# Patient Record
Sex: Male | Born: 1995 | Race: White | Hispanic: No | Marital: Single | Smoking: Never smoker
Health system: Southern US, Community
[De-identification: ages and names within clinical notes are randomized; demographics above are authoritative.]

## PROBLEM LIST (undated history)

## (undated) DIAGNOSIS — F909 Attention-deficit hyperactivity disorder, unspecified type: Secondary | ICD-10-CM

## (undated) DIAGNOSIS — K219 Gastro-esophageal reflux disease without esophagitis: Secondary | ICD-10-CM

## (undated) DIAGNOSIS — T7840XA Allergy, unspecified, initial encounter: Secondary | ICD-10-CM

## (undated) HISTORY — DX: Gastro-esophageal reflux disease without esophagitis: K21.9

## (undated) HISTORY — DX: Allergy, unspecified, initial encounter: T78.40XA

## (undated) HISTORY — DX: Attention-deficit hyperactivity disorder, unspecified type: F90.9

## (undated) HISTORY — PX: TONSILLECTOMY: SUR1361

---
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
## 2015-05-01 ENCOUNTER — Other Ambulatory Visit: Payer: Self-pay

## 2015-05-01 MED ORDER — OMEPRAZOLE 20 MG PO CPDR: 20.0000 mg | DELAYED_RELEASE_CAPSULE | Freq: Every day | ORAL | Status: DC

## 2015-06-30 ENCOUNTER — Ambulatory Visit (INDEPENDENT_AMBULATORY_CARE_PROVIDER_SITE_OTHER): Payer: BLUE CROSS/BLUE SHIELD | Admitting: Internal Medicine

## 2015-06-30 ENCOUNTER — Encounter: Payer: Self-pay | Admitting: Internal Medicine

## 2015-06-30 VITALS — BP 118/70 | HR 84 | Ht 71.5 in | Wt 203.5 lb

## 2015-06-30 DIAGNOSIS — K219 Gastro-esophageal reflux disease without esophagitis: Secondary | ICD-10-CM

## 2015-06-30 NOTE — Patient Instructions (Signed)
   See if you can stop the omeprazole by taking every other day for a few weeks and stop. Can use Pepcid complete as needed but if having frequent heartburn again then would go back on the omeprazole.  I can refill it but would need to see you in 1-2 years for more.  Contact me if you have bleeding, swallowing problems or unintentional weight loss.  I appreciate the opportunity to care for you. Iva Boop, MD, [facility]

## 2015-07-01 ENCOUNTER — Encounter: Payer: Self-pay | Admitting: Internal Medicine

## 2015-07-01 NOTE — Progress Notes (Signed)
   Subjective:    Patient ID: Alex Marquez, male    DOB: 1996-07-31, 19 y.o.   MRN: [id number] Chief complaint: Reflux disease follow-up HPI The patient is here with his mother today. He is doing very well on 20 mg omeprazole daily with no heartburn or indigestion symptoms. His mom relates she has had to take a PPI since her 30s. School is going well though he is no longer working part-time in the [employer] like he was able to do before. One caffeinated beverage daily Medications, allergies, past medical history, past surgical history, family history and social history are reviewed and updated in the EMR.  Review of Systems As above    Objective:   Physical Exam BP 118/70 mmHg  Pulse 84  Ht 5' 11.5" (1.816 m)  Wt 203 lb 8 oz (92.307 kg)  BMI 27.99 kg/m2 Wt Readings from Last 3 Encounters:  06/30/15 203 lb 8 oz (92.307 kg) (94 %*, Z = 1.55)  12/22/14 196 lb 4 oz (89.018 kg) (92 %*, Z = 1.43)   * Growth percentiles are based on CDC 2-20 Years data.      Assessment & Plan:   1. Gastroesophageal reflux disease, esophagitis presence not specified    he will see if he can come off PPI and take intermittent Pepcid complete or something equivalent to that. I think this is worth a try. It seems likely that he will need a daily PPI. I will see him every one to 2 years and can refill PPI in between if he needs that. All questions answered today.  I appreciate the opportunity to care for this patient. CC: France Ravens, MD

## 2015-09-28 ENCOUNTER — Other Ambulatory Visit: Payer: Self-pay

## 2015-09-28 MED ORDER — OMEPRAZOLE 20 MG PO CPDR: 20.0000 mg | DELAYED_RELEASE_CAPSULE | Freq: Every day | ORAL | Status: DC

## 2016-10-17 ENCOUNTER — Other Ambulatory Visit: Payer: Self-pay

## 2016-10-17 MED ORDER — OMEPRAZOLE 20 MG PO CPDR: 20.0000 mg | DELAYED_RELEASE_CAPSULE | Freq: Every day | ORAL | 5 refills | Status: DC

## 2016-10-17 NOTE — Telephone Encounter (Signed)
Omeprazole sent in as requested. 

## 2016-10-19 DIAGNOSIS — J0100 Acute maxillary sinusitis, unspecified: Secondary | ICD-10-CM | POA: Diagnosis not present

## 2017-08-11 ENCOUNTER — Telehealth: Payer: Self-pay | Admitting: Internal Medicine

## 2017-08-11 MED ORDER — OMEPRAZOLE 20 MG PO CPDR
20.0000 mg | DELAYED_RELEASE_CAPSULE | Freq: Every day | ORAL | 2 refills | Status: DC
Start: 2017-08-11 — End: 2017-10-17

## 2017-08-11 NOTE — Telephone Encounter (Signed)
Patient has January appointment , refilled omeprazole.

## 2017-10-17 ENCOUNTER — Ambulatory Visit: Payer: BLUE CROSS/BLUE SHIELD | Admitting: Internal Medicine

## 2017-10-17 ENCOUNTER — Encounter: Payer: Self-pay | Admitting: Internal Medicine

## 2017-10-17 VITALS — BP 110/80 | HR 68 | Ht 71.5 in | Wt 203.0 lb

## 2017-10-17 DIAGNOSIS — K219 Gastro-esophageal reflux disease without esophagitis: Secondary | ICD-10-CM | POA: Diagnosis not present

## 2017-10-17 MED ORDER — OMEPRAZOLE 20 MG PO CPDR: 20.0000 mg | DELAYED_RELEASE_CAPSULE | Freq: Every day | ORAL | 3 refills | Status: DC

## 2017-10-17 NOTE — Progress Notes (Signed)
   Alex Marquez 22 y.o. 12/30/1995 [id number]  Assessment & Plan:   Encounter Diagnosis  Name Primary?  . Gastroesophageal reflux disease, esophagitis presence not specified Yes    Doing well.  Refill PPI  See me in 2 yrs or prn Unless PCP can refill PPI -    Subjective:   Chief Complaint: f/u GERD  HPI Here for f/u - GERD well controlled on 20 mg omeprazole daily. Establishing with a PCP later this year  No Known Allergies Current Meds  Medication Sig  . omeprazole (PRILOSEC) 20 MG capsule Take 1 capsule (20 mg total) by mouth daily before breakfast.  . [DISCONTINUED] omeprazole (PRILOSEC) 20 MG capsule Take 1 capsule (20 mg total) by mouth daily before breakfast.   Past Medical History:  Diagnosis Date  . ADHD (attention deficit hyperactivity disorder)    pt states ADD  . GERD (gastroesophageal reflux disease)    Past Surgical History:  Procedure Laterality Date  . TONSILLECTOMY     Social History   Social History Narrative   Single, lives with his parents. 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   One to 2 glasses of tea daily as far as caffeine is concerned.   family history includes Colon cancer in his maternal grandfather; Colon polyps in his mother; Heart disease in his maternal grandmother; Irritable bowel syndrome in his unknown relative.   Review of Systems As above  Objective:   Physical Exam BP 110/80   Pulse 68   Ht 5' 11.5" (1.816 m)   Wt 203 lb (92.1 kg)   BMI 27.92 kg/m  NAD

## 2017-10-17 NOTE — Patient Instructions (Signed)
Prilosec reordered.  Follow up with us as needed.

## 2018-01-07 NOTE — Progress Notes (Addendum)
[facility] [address], [address] [place], [place] [address] [phone] [phone]  Date:  01/08/2018   Name:  Alex MinksLogan Marquez   DOB:  03/29/1996   MRN:  [id number]  PCP:  Pearline Cablesopland, Brooklee Michelin C, MD    Chief Complaint: New Patient (Initial Visit) (no current issues to discuss)   History of Present Illness:  Alex Marquez is a 22 y.o. very pleasant male patient who presents with the following:  Here today as a new patient to establish care History of reflux, on PPI per Dr. 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  [place]  S/p tonsillectomy- otherwise no other significant PMhx In his free time he enjoys doing some work outdoors, working on Dietitianvideo games, working on cars   He is single, no children  He is a non smoker, no tobacco, rare alcohol   No recent labs  He thinks he had a tetanus shot at least 10 years ago - would like to have booster today  He does not get a lot of formal exercise but is on his feet a lot at work  Patient Active Problem List   Diagnosis Date Noted  . Esophageal reflux 12/22/2014    Past Medical History:  Diagnosis Date  . ADHD (attention deficit hyperactivity disorder)    pt states ADD  . Allergy   . GERD (gastroesophageal reflux disease)     Past Surgical History:  Procedure Laterality Date  . TONSILLECTOMY    . TOOTH EXTRACTION  2008    Social History   Tobacco Use  . Smoking status: Never Smoker  . Smokeless tobacco: Never Used  Substance Use Topics  . Alcohol use: Yes    Alcohol/week: 0.0 oz    Comment: "not often"  . Drug use: No    Family History  Problem Relation Age of Onset  . Colon cancer Maternal Grandfather        great  . Colon polyps Mother   . Heart disease Maternal Grandmother   . Irritable bowel syndrome Unknown     No Known  Allergies  Medication list has been reviewed and updated.  Current Outpatient Medications on File Prior to Visit  Medication Sig Dispense Refill  . omeprazole (PRILOSEC) 20 MG capsule Take 1 capsule (20 mg total) by mouth daily before breakfast. 90 capsule 3   No current facility-administered medications on file prior to visit.     Review of Systems:  As per HPI- otherwise negative.   Physical Examination: Vitals:   01/08/18 1603  BP: 122/80  Pulse: 80  Resp: 16  SpO2: 98%   Vitals:   01/08/18 1603  Weight: 201 lb (91.2 kg)  Height: 5' 11.5" (1.816 m)   Body mass index is 27.64 kg/m. Ideal Body Weight: Weight in (lb) to have BMI = 25: 181.4  GEN: WDWN, NAD, Non-toxic, A & O x 3, looks well  HEENT: Atraumatic, Normocephalic. Neck supple. No masses, No LAD.  Bilateral TM wnl, oropharynx normal.  PEERL,EOMI.   Ears and Nose: No external deformity. CV: RRR, No M/G/R. No JVD. No thrill. No extra heart sounds. PULM: CTA B, no wheezes, crackles, rhonchi. No retractions. No resp. distress. No accessory muscle use. ABD: S, NT, ND EXTR: No c/c/e NEURO Normal gait.  PSYCH: Normally interactive. Conversant.  Not depressed or anxious appearing.  Calm demeanor.    Assessment and Plan: Gastroesophageal reflux disease, esophagitis presence not specified  Immunization due - Plan: Td vaccine greater than or equal to 7yo preservative free IM  Screening for hyperlipidemia - Plan: Lipid panel  Screening for diabetes mellitus - Plan: Comprehensive metabolic panel, Hemoglobin A1c  Screening for deficiency anemia - Plan: CBC  Establishing care today Labs pending- he is NOT fasting today We are glad to refill prilosec when needed Td today  Signed Abbe Amsterdam, MD  Received his labs, letter to pt Results for orders placed or performed in visit on 01/08/18  CBC  Result Value Ref Range   WBC 5.6 4.0 - 10.5 K/uL   RBC 5.41 4.22 - 5.81 Mil/uL   Platelets 217.0 150.0 - 400.0  K/uL   Hemoglobin 16.2 13.0 - 17.0 g/dL   HCT 14.7 82.9 - 56.2 %   MCV 85.8 78.0 - 100.0 fl   MCHC 34.9 30.0 - 36.0 g/dL   RDW 13.0 86.5 - 78.4 %  Comprehensive metabolic panel  Result Value Ref Range   Sodium 139 135 - 145 mEq/L   Potassium 3.7 3.5 - 5.1 mEq/L   Chloride 103 96 - 112 mEq/L   CO2 28 19 - 32 mEq/L   Glucose, Bld 79 70 - 99 mg/dL   BUN 14 6 - 23 mg/dL   Creatinine, Ser 6.96 0.40 - 1.50 mg/dL   Total Bilirubin 1.1 0.2 - 1.2 mg/dL   Alkaline Phosphatase 87 39 - 117 U/L   AST 16 0 - 37 U/L   ALT 13 0 - 53 U/L   Total Protein 7.0 6.0 - 8.3 g/dL   Albumin 4.6 3.5 - 5.2 g/dL   Calcium 9.6 8.4 - 29.5 mg/dL   GFR 28.41 >32.44 mL/min  Lipid panel  Result Value Ref Range   Cholesterol 132 0 - 200 mg/dL   Triglycerides 01.0 0.0 - 149.0 mg/dL   HDL 27.25 (L) >36.64 mg/dL   VLDL 40.3 0.0 - 47.4 mg/dL   LDL Cholesterol 81 0 - 99 mg/dL   Total CHOL/HDL Ratio 3    NonHDL 92.57   Hemoglobin A1c  Result Value Ref Range   Hgb A1c MFr Bld 4.9 4.6 - 6.5 %

## 2018-01-08 ENCOUNTER — Ambulatory Visit: Payer: BLUE CROSS/BLUE SHIELD | Admitting: Family Medicine

## 2018-01-08 ENCOUNTER — Encounter: Payer: Self-pay | Admitting: Family Medicine

## 2018-01-08 VITALS — BP 122/80 | HR 80 | Resp 16 | Ht 71.5 in | Wt 201.0 lb

## 2018-01-08 DIAGNOSIS — Z131 Encounter for screening for diabetes mellitus: Secondary | ICD-10-CM | POA: Diagnosis not present

## 2018-01-08 DIAGNOSIS — K219 Gastro-esophageal reflux disease without esophagitis: Secondary | ICD-10-CM | POA: Diagnosis not present

## 2018-01-08 DIAGNOSIS — Z130 Encounter for screening for diseases of the blood and blood-forming organs and certain disorders involving the immune mechanism: Secondary | ICD-10-CM

## 2018-01-08 DIAGNOSIS — Z13220 Encounter for screening for lipoid disorders: Secondary | ICD-10-CM

## 2018-01-08 DIAGNOSIS — Z23 Encounter for immunization: Secondary | ICD-10-CM

## 2018-01-08 NOTE — Patient Instructions (Signed)
Good to see you today- take care and I will be in touch with your labs asap You got a tetanus booster today  Continue to stay active

## 2018-01-09 LAB — CBC
HEMATOCRIT: 46.4 % (ref 39.0–52.0)
Hemoglobin: 16.2 g/dL (ref 13.0–17.0)
MCHC: 34.9 g/dL (ref 30.0–36.0)
MCV: 85.8 fl (ref 78.0–100.0)
Platelets: 217 10*3/uL (ref 150.0–400.0)
RBC: 5.41 Mil/uL (ref 4.22–5.81)
RDW: 12.5 % (ref 11.5–15.5)
WBC: 5.6 10*3/uL (ref 4.0–10.5)

## 2018-01-09 LAB — COMPREHENSIVE METABOLIC PANEL
ALT: 13 U/L (ref 0–53)
AST: 16 U/L (ref 0–37)
Albumin: 4.6 g/dL (ref 3.5–5.2)
Alkaline Phosphatase: 87 U/L (ref 39–117)
BUN: 14 mg/dL (ref 6–23)
CHLORIDE: 103 meq/L (ref 96–112)
CO2: 28 mEq/L (ref 19–32)
Calcium: 9.6 mg/dL (ref 8.4–10.5)
Creatinine, Ser: 1.15 mg/dL (ref 0.40–1.50)
GFR: 84.8 mL/min (ref >60.00)
GLUCOSE: 79 mg/dL (ref 70–99)
POTASSIUM: 3.7 meq/L (ref 3.5–5.1)
SODIUM: 139 meq/L (ref 135–145)
TOTAL PROTEIN: 7 g/dL (ref 6.0–8.3)
Total Bilirubin: 1.1 mg/dL (ref 0.2–1.2)

## 2018-01-09 LAB — LIPID PANEL
CHOL/HDL RATIO: 3
Cholesterol: 132 mg/dL (ref 0–200)
HDL: 39 mg/dL — ABNORMAL LOW (ref >39.00)
LDL Cholesterol: 81 mg/dL (ref 0–99)
NONHDL: 92.57
Triglycerides: 59 mg/dL (ref 0.0–149.0)
VLDL: 11.8 mg/dL (ref 0.0–40.0)

## 2018-01-09 LAB — HEMOGLOBIN A1C: HEMOGLOBIN A1C: 4.9 % (ref 4.6–6.5)

## 2018-08-22 ENCOUNTER — Ambulatory Visit: Payer: BLUE CROSS/BLUE SHIELD | Admitting: Family Medicine

## 2018-08-22 ENCOUNTER — Encounter: Payer: Self-pay | Admitting: Family Medicine

## 2018-08-22 VITALS — BP 128/80 | HR 98 | Temp 98.0°F | Resp 16 | Ht 71.5 in | Wt 207.0 lb

## 2018-08-22 DIAGNOSIS — H6123 Impacted cerumen, bilateral: Secondary | ICD-10-CM

## 2018-08-22 DIAGNOSIS — Z23 Encounter for immunization: Secondary | ICD-10-CM

## 2018-08-22 NOTE — Progress Notes (Signed)
[facility] [address], [address] [place], [place] [address] [phone] [phone]  Date:  08/22/2018   Name:  Alex Marquez   DOB:  11/10/1995   MRN:  [id number]  PCP:  Pearline Cablesopland, Marykathryn Carboni C, MD    Chief Complaint: Ear Fullness (right ear, itching, trouble hearing, started last night, tried to pour baby oil in ear-no improvement)   History of Present Illness:  Alex MinksLogan Wayne is a 22 y.o. very pleasant male patient who presents with the following:  Generally healthy man- here today with right ear feeling clogged No fever or cough  He has a history of cerumen impaction and suspects this again He tried putting baby oil  Otherwise he is feeling well Would like a flu shot today as well     Patient Active Problem List   Diagnosis Date Noted  . Esophageal reflux 12/22/2014    Past Medical History:  Diagnosis Date  . ADHD (attention deficit hyperactivity disorder)    pt states ADD  . Allergy   . GERD (gastroesophageal reflux disease)     Past Surgical History:  Procedure Laterality Date  . TONSILLECTOMY    . TOOTH EXTRACTION  2008    Social History   Tobacco Use  . Smoking status: Never Smoker  . Smokeless tobacco: Never Used  Substance Use Topics  . Alcohol use: Yes    Alcohol/week: 0.0 standard drinks    Comment: "not often"  . Drug use: No    Family History  Problem Relation Age of Onset  . Colon cancer Maternal Grandfather        great  . Colon polyps Mother   . Heart disease Maternal Grandmother   . Irritable bowel syndrome Unknown     No Known Allergies  Medication list has been reviewed and updated.  Current Outpatient Medications on File Prior to Visit  Medication Sig Dispense Refill  . omeprazole (PRILOSEC) 20 MG capsule Take 1 capsule (20 mg total) by mouth daily before breakfast. 90 capsule 3   No current facility-administered medications on file prior to visit.     Review of Systems:  As  per HPI- otherwise negative.   Physical Examination: Vitals:   08/22/18 1452  BP: 128/80  Pulse: 98  Resp: 16  Temp: 98 F (36.7 C)  SpO2: 98%   Vitals:   08/22/18 1452  Weight: 207 lb (93.9 kg)  Height: 5' 11.5" (1.816 m)   Body mass index is 28.47 kg/m. Ideal Body Weight: Weight in (lb) to have BMI = 25: 181.4  GEN: WDWN, NAD, Non-toxic, A & O x 3, normal weight  HEENT: Atraumatic, Normocephalic. Neck supple. No masses, No LAD. Ears and Nose: No external deformity. CV: RRR, No M/G/R. No JVD. No thrill. No extra heart sounds. PULM: CTA B, no wheezes, crackles, rhonchi. No retractions. No resp. distress. No accessory muscle use. EXTR: No c/c/e NEURO Normal gait.  PSYCH: Normally interactive. Conversant. Not depressed or anxious appearing.  Calm demeanor.  Both ears are occluded with cerumen Irrigated and removed, TM normal, he felt better   Assessment and Plan: Bilateral impacted cerumen  Needs flu shot  Removed ear wax and resolved problem Flu shot given   Signed Abbe AmsterdamJessica Otniel Hoe, MD

## 2018-10-19 ENCOUNTER — Other Ambulatory Visit: Payer: Self-pay | Admitting: Family Medicine

## 2018-10-19 MED ORDER — OMEPRAZOLE 20 MG PO CPDR: 20.0000 mg | DELAYED_RELEASE_CAPSULE | Freq: Every day | ORAL | 3 refills | Status: DC

## 2018-10-19 NOTE — Telephone Encounter (Signed)
Requested medication (s) are due for refill today:  Yes    Requested medication (s) are on the active medication list:  yes  Future visit scheduled:  yes  Last Refill: 10/17/17; #90; RF x 3 ; ordered per Dr. Leone Payor Has not been ordered by Dr. Patsy Lager; pt. Was directed by Dr. Marvell Fuller office, to get refills from PCP    Requested Prescriptions  Pending Prescriptions Disp Refills   omeprazole (PRILOSEC) 20 MG capsule 90 capsule 3    Sig: Take 1 capsule (20 mg total) by mouth daily before breakfast.     Gastroenterology: Proton Pump Inhibitors Passed - 10/19/2018 12:20 PM      Passed - Valid encounter within last 12 months    Recent Outpatient Visits          1 month ago Bilateral impacted cerumen   [facility] at [facility] Copland, Nye C, MD   9 months ago Gastroesophageal reflux disease, esophagitis presence not specified   [facility] representative at [facility], Gwenlyn Found, MD      Future Appointments            In 3 months Copland, Gwenlyn Found, MD [facility], [facility]

## 2018-10-19 NOTE — Telephone Encounter (Signed)
Copied from CRM [id number]. Topic: Quick Communication - Rx Refill/Question >> Oct 19, 2018 12:09 PM Lorrine Kin, NT wrote: **patient states that Dr Leone Payor said that he could get this from his PCP and he did not need to see him in the office unless he was having problems.**  Medication: omeprazole (PRILOSEC) 20 MG capsule  Has the patient contacted their pharmacy? Yes.   (Agent: If no, request that the patient contact the pharmacy for the refill.) (Agent: If yes, when and what did the pharmacy advise?)  Preferred Pharmacy (with phone number or street name): [facility] - [place] - [address] [address] [address]  Agent: Please be advised that RX refills may take up to 3 business days. We ask that you follow-up with your pharmacy.

## 2018-11-21 DIAGNOSIS — J0100 Acute maxillary sinusitis, unspecified: Secondary | ICD-10-CM | POA: Diagnosis not present

## 2018-11-21 DIAGNOSIS — J111 Influenza due to unidentified influenza virus with other respiratory manifestations: Secondary | ICD-10-CM | POA: Diagnosis not present

## 2018-11-21 DIAGNOSIS — J029 Acute pharyngitis, unspecified: Secondary | ICD-10-CM | POA: Diagnosis not present

## 2019-01-17 ENCOUNTER — Telehealth: Payer: Self-pay

## 2019-01-17 ENCOUNTER — Encounter: Payer: BLUE CROSS/BLUE SHIELD | Admitting: Family Medicine

## 2019-01-17 NOTE — Telephone Encounter (Signed)
Copied from CRM [id number]. Topic: Appointment Scheduling - Scheduling Inquiry for Clinic >> Jan 17, 2019  8:29 AM Alex Marquez E wrote: Reason for CRM: Pt wants to cancel appt on 4.15.20

## 2019-01-17 NOTE — Telephone Encounter (Signed)
Offered patient telephone and webex appointments patient declined both and would like to reschedule for later date to come in person. Appointment canceled for now.

## 2019-01-23 ENCOUNTER — Encounter: Payer: BLUE CROSS/BLUE SHIELD | Admitting: Family Medicine

## 2019-03-11 ENCOUNTER — Ambulatory Visit (INDEPENDENT_AMBULATORY_CARE_PROVIDER_SITE_OTHER): Payer: BLUE CROSS/BLUE SHIELD | Admitting: Family Medicine

## 2019-03-11 ENCOUNTER — Encounter: Payer: Self-pay | Admitting: Family Medicine

## 2019-03-11 ENCOUNTER — Other Ambulatory Visit: Payer: Self-pay

## 2019-03-11 ENCOUNTER — Emergency Department (HOSPITAL_BASED_OUTPATIENT_CLINIC_OR_DEPARTMENT_OTHER)
Admission: EM | Admit: 2019-03-11 | Discharge: 2019-03-11 | Disposition: A | Discharge status: Home / Self Care | Payer: BLUE CROSS/BLUE SHIELD | Attending: Emergency Medicine | Admitting: Emergency Medicine

## 2019-03-11 ENCOUNTER — Encounter (HOSPITAL_BASED_OUTPATIENT_CLINIC_OR_DEPARTMENT_OTHER): Payer: Self-pay | Admitting: *Deleted

## 2019-03-11 DIAGNOSIS — W541XXA Struck by dog, initial encounter: Secondary | ICD-10-CM | POA: Diagnosis not present

## 2019-03-11 DIAGNOSIS — S4401XA Injury of ulnar nerve at upper arm level, right arm, initial encounter: Secondary | ICD-10-CM | POA: Diagnosis not present

## 2019-03-11 DIAGNOSIS — S6401XA Injury of ulnar nerve at wrist and hand level of right arm, initial encounter: Secondary | ICD-10-CM | POA: Insufficient documentation

## 2019-03-11 DIAGNOSIS — Y999 Unspecified external cause status: Secondary | ICD-10-CM | POA: Diagnosis not present

## 2019-03-11 DIAGNOSIS — Y939 Activity, unspecified: Secondary | ICD-10-CM | POA: Diagnosis not present

## 2019-03-11 DIAGNOSIS — S5401XA Injury of ulnar nerve at forearm level, right arm, initial encounter: Secondary | ICD-10-CM

## 2019-03-11 DIAGNOSIS — R202 Paresthesia of skin: Secondary | ICD-10-CM

## 2019-03-11 DIAGNOSIS — Y929 Unspecified place or not applicable: Secondary | ICD-10-CM | POA: Insufficient documentation

## 2019-03-11 DIAGNOSIS — R200 Anesthesia of skin: Secondary | ICD-10-CM | POA: Diagnosis not present

## 2019-03-11 NOTE — Discharge Instructions (Addendum)
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  At this time there does not appear to be the presence of an emergent medical condition, however there is always the potential for conditions to change. Please read and follow the below instructions.  Please return to the Emergency Department immediately for any new or worsening symptoms. Please be sure to follow up with your Primary Care Provider within one week regarding your visit today; please call their office to schedule an appointment even if you are feeling better for a follow-up visit. You may use over-the-counter ibuprofen as directed the on the packaging to help with pain.  The tingling sensation that you are feeling should improve over the next few days.  Please use rest ice and elevation to help with your symptoms.  If your symptoms do not improve please contact Dr. Izora Ribas, the hand specialist,'s office for further evaluation.  For any new or worsening symptoms please return to the emergency department.  Get help right away if: You have severe pain. You have severe swelling. You cannot move your wrist, hand, or elbow. You cannot feel parts of your hand, wrist, or arm. Any new/concerning or worsening symptoms  Please read the additional information packets attached to your discharge summary.

## 2019-03-11 NOTE — Progress Notes (Signed)
[facility] [address], [address] [place], [place] [address] [phone] [phone]  Date:  03/11/2019   Name:  Alex MinksLogan Marquez   DOB:  07/28/1996   MRN:  [id number]  PCP:  Alex Marquez, Alex Befort C, MD    Chief Complaint: No chief complaint on file.   History of Present Illness:  Alex Marquez is a [age] y.o. very pleasant male patient who presents with the following:  Virtual visit today Pt location is home Provider location is office Pt ID confirmed with name and DOB, he gives consent for virtual visit today  Pt with history of ADHD and allergies I last saw him in the fall with a clogged ear   Today concern of his dog head-butting his RIGHT elbow yesterday evening- he had a "funny bone" type pain but then his arm went numb.  It has remained mainly numb all day, perhaps getting worse His hand is not painful but it is totally asleep The arm does not seem weak, he just cannot feel anything The hand moves normally  He is otherwise feeling fine  Patient scheduled for my 4:20 time slot today.  Our office is actually already closed, I am unfortunately not able to bring him in  Patient Active Problem List   Diagnosis Date Noted  . Esophageal reflux 12/22/2014    Past Medical History:  Diagnosis Date  . ADHD (attention deficit hyperactivity disorder)    pt states ADD  . Allergy   . GERD (gastroesophageal reflux disease)     Past Surgical History:  Procedure Laterality Date  . TONSILLECTOMY    . TOOTH EXTRACTION  2008    Social History   Tobacco Use  . Smoking status: Never Smoker  . Smokeless tobacco: Never Used  Substance Use Topics  . Alcohol use: Yes    Alcohol/week: 0.0 standard drinks    Comment: "not often"  . Drug use: No    Family History  Problem Relation Age of Onset  . Colon cancer Maternal Grandfather        great  . Colon polyps Mother   . Heart disease Maternal Grandmother   . Irritable bowel syndrome  Unknown     No Known Allergies  Medication list has been reviewed and updated.  Current Outpatient Medications on File Prior to Visit  Medication Sig Dispense Refill  . omeprazole (PRILOSEC) 20 MG capsule Take 1 capsule (20 mg total) by mouth daily before breakfast. 90 capsule 3   No current facility-administered medications on file prior to visit.     Review of Systems:  As per HPI- otherwise negative. Otherwise feeling well, no fever or other symptoms  Physical Examination: There were no vitals filed for this visit. There were no vitals filed for this visit. There is no height or weight on file to calculate BMI. Ideal Body Weight:    Patient observed over video.  He looks well I do not see any redness, swelling, or wound over his elbow  Assessment and Plan: Right arm numbness  Patient had a contusion to the point of his right elbow yesterday.  Since that time he has had complete numbness of his right hand I suspect his ulnar nerve was affected in some way.   I am concerned that his hand and arm are still numb, 24 hours later. We will have him come into the emergency room for evaluation.  Would have preferred to see him in the office, but  we are now closed. He agrees with plan Signed Abbe Amsterdam, MD

## 2019-03-11 NOTE — ED Notes (Signed)
Per patient, his [place] dog jumped to him and hit his right elbow with the dog's head yesterday.  His right hand has been numbed since then.  He denies pain.

## 2019-03-11 NOTE — ED Triage Notes (Signed)
Elbow injury yesterday. His right hand has been numb since the injury.

## 2019-03-11 NOTE — ED Provider Notes (Signed)
[facility] EMERGENCY DEPARTMENT Provider Note   CSN: [id number] Arrival date & time: 03/11/19  1714    History   Chief Complaint No chief complaint on file.   HPI Alex Marquez is a 23 y.o. male presenting today for paresthesias of the right hand.  Patient reports that his dog head butted him yesterday around 5 PM on his right elbow.  Patient reports that he had a painful moderate in intensity shooting sensation from his elbow to his left fourth and fifth fingers that he describes as hitting his "funny bone".  Patient reports that his pain quickly subsided but he has had persistent tingling sensation to his fourth and fifth fingers that has remained constant without aggravating or alleviating factors.  Patient has not taken any medication prior to arrival for his symptoms.  He reports that he is an otherwise healthy 23 year old male with only past medical history of GERD and ADHD.  He denies any other injuries or concerns at this time.     HPI  Past Medical History:  Diagnosis Date  . ADHD (attention deficit hyperactivity disorder)    pt states ADD  . Allergy   . GERD (gastroesophageal reflux disease)     Patient Active Problem List   Diagnosis Date Noted  . Esophageal reflux 12/22/2014    Past Surgical History:  Procedure Laterality Date  . TONSILLECTOMY    . TOOTH EXTRACTION  2008        Home Medications    Prior to Admission medications   Medication Sig Start Date End Date Taking? Authorizing Provider  omeprazole (PRILOSEC) 20 MG capsule Take 1 capsule (20 mg total) by mouth daily before breakfast. 10/19/18  Yes Sandford Craze, NP    Family History Family History  Problem Relation Age of Onset  . Colon cancer Maternal Grandfather        great  . Colon polyps Mother   . Heart disease Maternal Grandmother   . Irritable bowel syndrome Other     Social History Social History   Tobacco Use  . Smoking status: Never Smoker  . Smokeless  tobacco: Never Used  Substance Use Topics  . Alcohol use: Yes    Alcohol/week: 0.0 standard drinks    Comment: "not often"  . Drug use: No     Allergies   Patient has no known allergies.   Review of Systems Review of Systems  Constitutional: Negative.  Negative for chills and fever.  Musculoskeletal: Positive for arthralgias (Resolved right elbow pain). Negative for joint swelling, neck pain and neck stiffness.  Skin: Negative.  Negative for color change and wound.  Neurological: Positive for numbness (Paresthesias right hand ulnar distribution).   Physical Exam Updated Vital Signs BP 127/77   Pulse 100   Temp 98.3 F (36.8 C) (Oral)   Resp 20   Ht 6' (1.829 m)   Wt 87.6 kg   SpO2 100%   BMI 26.18 kg/m   Physical Exam Constitutional:      General: He is not in acute distress.    Appearance: Normal appearance. He is well-developed. He is not ill-appearing or diaphoretic.  HENT:     Head: Normocephalic and atraumatic.     Right Ear: External ear normal.     Left Ear: External ear normal.     Nose: Nose normal.  Eyes:     General: Vision grossly intact. Gaze aligned appropriately.     Pupils: Pupils are equal, round, and reactive to light.  Neck:  Musculoskeletal: Full passive range of motion without pain, normal range of motion and neck supple. No spinous process tenderness or muscular tenderness.     Trachea: Trachea and phonation normal. No tracheal tenderness or tracheal deviation.  Cardiovascular:     Rate and Rhythm: Normal rate and regular rhythm.     Pulses:          Radial pulses are 2+ on the right side and 2+ on the left side.  Pulmonary:     Effort: Pulmonary effort is normal. No respiratory distress.  Abdominal:     General: There is no distension.     Palpations: Abdomen is soft.     Tenderness: There is no abdominal tenderness. There is no guarding or rebound.  Musculoskeletal: Normal range of motion.     Right shoulder: Normal.     Right  elbow: Normal.    Right wrist: Normal.     Cervical back: Normal.     Right upper arm: Normal.     Right forearm: Normal.     Right hand: He exhibits normal range of motion, no tenderness, normal capillary refill, no deformity, no laceration and no swelling. Decreased sensation noted. Decreased sensation is present in the ulnar distribution. Normal strength noted. He exhibits no thumb/finger opposition.       Hands:     Comments: Right hand: No gross deformities, skin intact. Fingers appear normal.  No tenderness to palpation. No snuffbox tenderness to palpation. No tenderness to palpation over flexor sheath.  Finger adduction/abduction intact with 5/5 strength.  Thumb opposition intact. Full active and resisted ROM to flexion/extension at wrist, MCP, PIP and DIP of all fingers.  FDS/FDP intact. Grip 5/5 strength.  Radial artery 2+ with <2sec cap refill in all fingers.  Sensation intact to light-tough in median/ulnar/radial distributions.  Patient reports tingling sensation worsened with palpation of the right fifth/fourth fingers.  No pain or increase in symptoms with motion of the neck, shoulder, elbow or wrist.  Compartments soft.   Skin:    General: Skin is warm and dry.  Neurological:     Mental Status: He is alert.     GCS: GCS eye subscore is 4. GCS verbal subscore is 5. GCS motor subscore is 6.     Comments: Speech is clear and goal oriented, follows commands Major Cranial nerves without deficit, no facial droop Normal strength in upper and lower extremities bilaterally including dorsiflexion and plantar flexion, strong and equal grip strength Moves extremities without ataxia, coordination intact Normal finger to nose and rapid alternating movements Normal gait  Psychiatric:        Behavior: Behavior normal.    ED Treatments / Results  Labs (all labs ordered are listed, but only abnormal results are displayed) Labs Reviewed - No data to display  EKG None  Radiology No  results found.  Procedures Procedures (including critical care time)  Medications Ordered in ED Medications - No data to display   Initial Impression / Assessment and Plan / ED Course  I have reviewed the triage vital signs and the nursing notes.  Pertinent labs & imaging results that were available during my care of the patient were reviewed by me and considered in my medical decision making (see chart for details).    23 year old male presents with tingling sensation of the right fourth and fifth fingers present since his dog struck his right elbow yesterday.  Physical examination is without sign of injury, no increase in pain with movement or  palpation, endorses paresthesias along the ulnar distribution of the hand, no tenderness along the cubital tunnel, no pain with movement of the neck, shoulder, elbow, wrist or hand.  Patient is neurovascularly intact to bilateral upper extremities, sensation is intact only reports increased tingling sensation with palpation.  No wounds present, no signs of DVT, cellulitis, septic arthritis or compartment syndrome.  Suspect patient with right ulnar nerve paresthesias, do not suspect central nervous involvement.  Discussed case with Dr. Anitra Lauth, plan at this time is to treat patient with NSAIDs, paresthesias should improve with time and rest.  Patient has been given referral to hand if symptoms persist.  No indication for imaging at this time.  At this time there does not appear to be any evidence of an acute emergency medical condition and the patient appears stable for discharge with appropriate outpatient follow up. Diagnosis was discussed with patient who verbalizes understanding of care plan and is agreeable to discharge. I have discussed return precautions with patient who verbalizes understanding of return precautions. Patient encouraged to follow-up with their PCP. All questions answered.  Patient has been discharged in good condition.  Note:  Portions of this report may have been transcribed using voice recognition software. Every effort was made to ensure accuracy; however, inadvertent computerized transcription errors may still be present. Final Clinical Impressions(s) / ED Diagnoses   Final diagnoses:  Injury of right ulnar nerve, unspecified injury location, initial encounter  Right hand paresthesia    ED Discharge Orders    None       Elizabeth Palau 03/11/19 1850    Gwyneth Sprout, MD 03/11/19 2217

## 2019-03-15 NOTE — Progress Notes (Signed)
[facility] [address] [place], [place] [address] [phone]  Date:  03/18/2019   Name:  Rafik Gilley   DOB:  1996/08/20   MRN:  [id number]  PCP:  Pearline Cables, MD    Chief Complaint: Elbow Pain (er follow up)   History of Present Illness:  Alex Marquez is a 23 y.o. very pleasant male patient who presents with the following:  Generally healthy young man following up today for nerve contusion He had contacted me on 6/1 with concern of his right forearm being numb status post a contusion to the elbow.  I was not able to see him in the office, as we were already closed at that time.  Given consistent symptoms for 24 hours, had him go to the emergency room he could be examined He was noted to have decreased sensation in the ulnar distribution of the right hand, fourth and fifth fingers. However normal strength, perfusion intact.  He was evaluated and released  He notes that his symptoms are getting better, but are still present.  Right now he notes numbness and tingling in the right pinky finger, less so in the right ring finger.  His hand is not quite as strong as the opposite side.  If he rubs or scratches the elbow, he may get some tingling shooting down to the hand. 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  He is having to modify his routine a bit because of this Patient Active Problem List   Diagnosis Date Noted  . Esophageal reflux 12/22/2014    Past Medical History:  Diagnosis Date  . ADHD (attention deficit hyperactivity disorder)    pt states ADD  . Allergy   . GERD (gastroesophageal reflux disease)     Past Surgical History:  Procedure Laterality Date  . TONSILLECTOMY    . TOOTH EXTRACTION  2008    Social  History   Tobacco Use  . Smoking status: Never Smoker  . Smokeless tobacco: Never Used  Substance Use Topics  . Alcohol use: Yes    Alcohol/week: 0.0 standard drinks    Comment: "not often"  . Drug use: No    Family History  Problem Relation Age of Onset  . Colon cancer Maternal Grandfather        great  . Colon polyps Mother   . Heart disease Maternal Grandmother   . Irritable bowel syndrome Other     No Known Allergies  Medication list has been reviewed and updated.  Current Outpatient Medications on File Prior to Visit  Medication Sig Dispense Refill  . omeprazole (PRILOSEC) 20 MG capsule Take 1 capsule (20 mg total) by mouth daily before breakfast. 90 capsule 3   No current facility-administered medications on file prior to visit.     Review of Systems:  As per HPI- otherwise negative.  No fever or chills, no cough Physical Examination: Vitals:   03/18/19 0910  BP: 118/82  Pulse: 85  Resp: 18  Temp: 98.2 F (36.8 C)  SpO2: 99%   Vitals:   03/18/19 0910  Weight: 193 lb (87.5 kg)  Height: 6' (1.829 m)   Body mass index is 26.18 kg/m. Ideal Body Weight: Weight in (lb) to have  BMI = 25: 183.9  GEN: WDWN, NAD, Non-toxic, A & O x 3, normal weight, looks well HEENT: Atraumatic, Normocephalic. Neck supple. No masses, No LAD. Ears and Nose: No external deformity. CV: RRR, No M/G/R. No JVD. No thrill. No extra heart sounds. PULM: CTA B, no wheezes, crackles, rhonchi. No retractions. No resp. distress. No accessory muscle use. EXTR: No c/c/e NEURO Normal gait.  PSYCH: Normally interactive. Conversant. Not depressed or anxious appearing.  Calm demeanor.  Normal range of motion of the elbow, hand, and finger joints.  Both hands are warm and well-perfused, strong radial pulses.  He has minimal weakness of the right pinky finger compared to the left side.  Otherwise hand, wrist, arm strength is normal.  He does have decreased sensation of the right pinky and ring  finger to gross touch, monofilament, and cannot well touch.  Capillary refill is normal in all fingers  Assessment and Plan: Right arm numbness  Contusion of right ulnar nerve, subsequent encounter  Following up today for contusion of right ulnar nerve.  He was seen in the ER a week ago, for numbness that occurred after his dog hit him in the elbow.  He is now about 1 week out from ER visit.  His symptoms are significantly better, but not yet resolved. Advised him to try applying ice to his elbow 2-3 times a day, can continue ibuprofen as needed.  I have asked him to alert me if not continueing to get better, getting worse, or not back to normal in 2 weeks.  Signed Abbe AmsterdamJessica Ethelyn Cerniglia, MD

## 2019-03-18 ENCOUNTER — Encounter: Payer: Self-pay | Admitting: Family Medicine

## 2019-03-18 ENCOUNTER — Ambulatory Visit: Payer: BLUE CROSS/BLUE SHIELD | Admitting: Family Medicine

## 2019-03-18 ENCOUNTER — Other Ambulatory Visit: Payer: Self-pay

## 2019-03-18 VITALS — BP 118/82 | HR 85 | Temp 98.2°F | Resp 18 | Ht 72.0 in | Wt 193.0 lb

## 2019-03-18 DIAGNOSIS — S5401XD Injury of ulnar nerve at forearm level, right arm, subsequent encounter: Secondary | ICD-10-CM | POA: Diagnosis not present

## 2019-03-18 DIAGNOSIS — R200 Anesthesia of skin: Secondary | ICD-10-CM | POA: Diagnosis not present

## 2019-03-18 NOTE — Patient Instructions (Signed)
It was good to see you again, but I am sorry that you hurt your elbow!  As you know, you contused the ulnar nerve which is causing the numbness in your pinky and ring finger.   As you seem to be improving, I think we are on the right track. Ibuprofen is ok as long as you are not getting any symptoms of gastritis.  I would also suggest that you ice your elbow for 15 minutes or so 2-3x a day to help reduce swelling and inflammation.    If your hand is not continuing to get better, starts to get worse or is not normal by 2 weeks from now please contact me again

## 2019-09-24 NOTE — Progress Notes (Signed)
[facility] [address] [place], [place] [address] [phone]  Date:  09/25/2019   Name:  Alex Marquez   DOB:  Nov 28, 1995   MRN:  [id number]  PCP:  Darreld Mclean, MD    Chief Complaint: No chief complaint on file.   History of Present Illness:  Alex Marquez is a 23 y.o. very pleasant male patient who presents with the following:  Virtual visit today for  Patient location is home, provider is at office Patient any confirmed with 2 factors, he gives consent for virtual visit today  I saw him most recently in June following a nerve contusion and numbness of the right forearm  Pt notes that last week he came down "with a cold," but did not have a fever at all.  He checked his temperature daily.  His sx started this past Monday- he is now about 10 days out He also had a mild cough and some aches He did not end up getting tested for covid He seemed to get over this but now feels like he has an ear infection His left ear is sore, hurts when he will swallow The right ear is ok His hearing seems to be ok No tinnitus He did have a ST but this is now resolved  No vomiting or diarrhea  He is continue to use once a day omeprazole.  He notes that when he has run out for a few days, symptoms will return especially eats anything greasy or heavy at all.  He would like to continue his PPI if possible  Patient Active Problem List   Diagnosis Date Noted  . Esophageal reflux 12/22/2014    Past Medical History:  Diagnosis Date  . ADHD (attention deficit hyperactivity disorder)    pt states ADD  . Allergy   . GERD (gastroesophageal reflux disease)     Past Surgical History:  Procedure Laterality Date  . TONSILLECTOMY    . TOOTH EXTRACTION  2008    Social History   Tobacco Use  . Smoking status: Never Smoker  . Smokeless tobacco: Never Used  Substance Use Topics  . Alcohol use: Yes    Alcohol/week: 0.0  standard drinks    Comment: "not often"  . Drug use: No    Family History  Problem Relation Age of Onset  . Colon cancer Maternal Grandfather        great  . Colon polyps Mother   . Heart disease Maternal Grandmother   . Irritable bowel syndrome Other     No Known Allergies  Medication list has been reviewed and updated.  Current Outpatient Medications on File Prior to Visit  Medication Sig Dispense Refill  . omeprazole (PRILOSEC) 20 MG capsule Take 1 capsule (20 mg total) by mouth daily before breakfast. 90 capsule 3   No current facility-administered medications on file prior to visit.    Review of Systems:  As per HPI- otherwise negative. No fever chills  Physical Examination: There were no vitals filed for this visit. There were no vitals filed for this visit. There is no height or weight on file to calculate BMI. Ideal Body Weight:    He has been checking his temp- norma Pt observed over video -he looks well, his normal self No cough, wheezing, distress is noted  Assessment and Plan: Acute otitis media, unspecified otitis media type - Plan: amoxicillin (AMOXIL) 500 MG capsule  Gastroesophageal reflux disease,  unspecified whether esophagitis present - Plan: omeprazole (PRILOSEC) 20 MG capsule  Virtual visit today for concern of otitis media.  Patient was ill last week with URI type symptoms.  He did not end up being tested for COVID-19.  At this point his symptoms are resolved except for ear pain, which does sound like otitis media.  We will treat him with amoxicillin for 10 days We discussed having him tested for COVID-19.  He declines testing at this time.  He is almost 10 days out from origination of symptoms, so his quarantine time would be about finished in any case.  I did encourage him to continue social distancing and mask wearing  We will also refill Prilosec, can continue to use as needed  I have asked him to let me know if not feeling better in a few  days, sooner if getting worse  Signed Abbe Amsterdam, MD

## 2019-09-25 ENCOUNTER — Other Ambulatory Visit: Payer: Self-pay

## 2019-09-25 ENCOUNTER — Telehealth: Payer: Self-pay

## 2019-09-25 ENCOUNTER — Encounter: Payer: Self-pay | Admitting: Family Medicine

## 2019-09-25 ENCOUNTER — Ambulatory Visit (INDEPENDENT_AMBULATORY_CARE_PROVIDER_SITE_OTHER): Payer: BC Managed Care – PPO | Admitting: Family Medicine

## 2019-09-25 DIAGNOSIS — H6690 Otitis media, unspecified, unspecified ear: Secondary | ICD-10-CM | POA: Diagnosis not present

## 2019-09-25 DIAGNOSIS — K219 Gastro-esophageal reflux disease without esophagitis: Secondary | ICD-10-CM

## 2019-09-25 MED ORDER — OMEPRAZOLE 20 MG PO CPDR: 20.0000 mg | DELAYED_RELEASE_CAPSULE | Freq: Every day | ORAL | 3 refills | Status: DC

## 2019-09-25 MED ORDER — AMOXICILLIN 500 MG PO CAPS: 1000.0000 mg | ORAL_CAPSULE | Freq: Two times a day (BID) | ORAL | 0 refills | Status: DC

## 2019-09-25 NOTE — Telephone Encounter (Signed)
Copied from [place] [phone]. Topic: General - Other >> Sep 25, 2019  3:41 PM Leward Quan A wrote: Reason for CRM: Patient called to say that he is waiting on the link for his appointment

## 2019-09-26 DIAGNOSIS — Z20828 Contact with and (suspected) exposure to other viral communicable diseases: Secondary | ICD-10-CM | POA: Diagnosis not present

## 2019-10-20 DIAGNOSIS — Z20822 Contact with and (suspected) exposure to covid-19: Secondary | ICD-10-CM | POA: Diagnosis not present

## 2019-12-30 DIAGNOSIS — Z20822 Contact with and (suspected) exposure to covid-19: Secondary | ICD-10-CM | POA: Diagnosis not present

## 2019-12-30 DIAGNOSIS — B349 Viral infection, unspecified: Secondary | ICD-10-CM | POA: Diagnosis not present

## 2020-02-13 DIAGNOSIS — H5213 Myopia, bilateral: Secondary | ICD-10-CM | POA: Diagnosis not present

## 2020-03-12 ENCOUNTER — Encounter: Payer: Self-pay | Admitting: Family Medicine

## 2020-03-12 ENCOUNTER — Ambulatory Visit (INDEPENDENT_AMBULATORY_CARE_PROVIDER_SITE_OTHER): Payer: BC Managed Care – PPO | Admitting: Family Medicine

## 2020-03-12 ENCOUNTER — Other Ambulatory Visit: Payer: Self-pay

## 2020-03-12 VITALS — BP 120/80 | HR 95 | Temp 97.6°F | Resp 18 | Ht 72.0 in | Wt 198.8 lb

## 2020-03-12 DIAGNOSIS — H6123 Impacted cerumen, bilateral: Secondary | ICD-10-CM | POA: Diagnosis not present

## 2020-03-12 DIAGNOSIS — H6503 Acute serous otitis media, bilateral: Secondary | ICD-10-CM | POA: Diagnosis not present

## 2020-03-12 MED ORDER — CEFDINIR 300 MG PO CAPS: 300.0000 mg | ORAL_CAPSULE | Freq: Two times a day (BID) | ORAL | 0 refills | Status: DC

## 2020-03-12 MED ORDER — OFLOXACIN 0.3 % OT SOLN: 10.0000 [drp] | Freq: Every day | OTIC | 1 refills | Status: DC

## 2020-03-12 NOTE — Patient Instructions (Signed)
Earwax Buildup, Adult The ears produce a substance called earwax that helps keep bacteria out of the ear and protects the skin in the ear canal. Occasionally, earwax can build up in the ear and cause discomfort or hearing loss. What increases the risk? This condition is more likely to develop in people who:  Are male.  Are elderly.  Naturally produce more earwax.  Clean their ears often with cotton swabs.  Use earplugs often.  Use in-ear headphones often.  Wear hearing aids.  Have narrow ear canals.  Have earwax that is overly thick or sticky.  Have eczema.  Are dehydrated.  Have excess hair in the ear canal. What are the signs or symptoms? Symptoms of this condition include:  Reduced or muffled hearing.  A feeling of fullness in the ear or feeling that the ear is plugged.  Fluid coming from the ear.  Ear pain.  Ear itch.  Ringing in the ear.  Coughing.  An obvious piece of earwax that can be seen inside the ear canal. How is this diagnosed? This condition may be diagnosed based on:  Your symptoms.  Your medical history.  An ear exam. During the exam, your health care provider will look into your ear with an instrument called an otoscope. You may have tests, including a hearing test. How is this treated? This condition may be treated by:  Using ear drops to soften the earwax.  Having the earwax removed by a health care provider. The health care provider may: ? Flush the ear with water. ? Use an instrument that has a loop on the end (curette). ? Use a suction device.  Surgery to remove the wax buildup. This may be done in severe cases. Follow these instructions at home:   Take over-the-counter and prescription medicines only as told by your health care provider.  Do not put any objects, including cotton swabs, into your ear. You can clean the opening of your ear canal with a washcloth or facial tissue.  Follow instructions from your health care  provider about cleaning your ears. Do not over-clean your ears.  Drink enough fluid to keep your urine clear or pale yellow. This will help to thin the earwax.  Keep all follow-up visits as told by your health care provider. If earwax builds up in your ears often or if you use hearing aids, consider seeing your health care provider for routine, preventive ear cleanings. Ask your health care provider how often you should schedule your cleanings.  If you have hearing aids, clean them according to instructions from the manufacturer and your health care provider. Contact a health care provider if:  You have ear pain.  You develop a fever.  You have blood, pus, or other fluid coming from your ear.  You have hearing loss.  You have ringing in your ears that does not go away.  Your symptoms do not improve with treatment.  You feel like the room is spinning (vertigo). Summary  Earwax can build up in the ear and cause discomfort or hearing loss.  The most common symptoms of this condition include reduced or muffled hearing and a feeling of fullness in the ear or feeling that the ear is plugged.  This condition may be diagnosed based on your symptoms, your medical history, and an ear exam.  This condition may be treated by using ear drops to soften the earwax or by having the earwax removed by a health care provider.  Do not put any   objects, including cotton swabs, into your ear. You can clean the opening of your ear canal with a washcloth or facial tissue. This information is not intended to replace advice given to you by your health care provider. Make sure you discuss any questions you have with your health care provider. Document Revised: 09/08/2017 Document Reviewed: 12/07/2016 Elsevier Patient Education  2020 [employer]  

## 2020-03-12 NOTE — Progress Notes (Signed)
Patient ID: Alex Marquez, male    DOB: Aug 29, 1996  Age: 24 y.o. MRN: [id number]    Subjective:  Subjective  HPI Alex Marquez presents for ears feeling plugged.  He has a hx of cerumen impaction   Review of Systems  Constitutional: Negative for appetite change, diaphoresis, fatigue and unexpected weight change.  HENT:       Ears feel plugged   Eyes: Negative for pain, redness and visual disturbance.  Respiratory: Negative for cough, chest tightness, shortness of breath and wheezing.   Cardiovascular: Negative for chest pain, palpitations and leg swelling.  Endocrine: Negative for cold intolerance, heat intolerance, polydipsia, polyphagia and polyuria.  Genitourinary: Negative for difficulty urinating, dysuria and frequency.  Neurological: Negative for dizziness, light-headedness, numbness and headaches.    History Past Medical History:  Diagnosis Date  . ADHD (attention deficit hyperactivity disorder)    pt states ADD  . Allergy   . GERD (gastroesophageal reflux disease)     He has a past surgical history that includes Tonsillectomy and Tooth extraction (2008).   His family history includes Colon cancer in his maternal grandfather; Colon polyps in his mother; Heart disease in his maternal grandmother; Irritable bowel syndrome in an other family member.He reports that he has never smoked. He has never used smokeless tobacco. He reports current alcohol use. He reports that he does not use drugs.  Current Outpatient Medications on File Prior to Visit  Medication Sig Dispense Refill  . omeprazole (PRILOSEC) 20 MG capsule Take 1 capsule (20 mg total) by mouth daily before breakfast. 90 capsule 3   No current facility-administered medications on file prior to visit.     Objective:  Objective  Physical Exam Vitals and nursing note reviewed.  Constitutional:      General: He is sleeping.     Appearance: He is well-developed.  HENT:     Head: Normocephalic and atraumatic.   Right Ear: There is impacted cerumen.     Left Ear: There is impacted cerumen.     Ears:   Eyes:     Pupils: Pupils are equal, round, and reactive to light.  Neck:     Thyroid: No thyromegaly.  Cardiovascular:     Rate and Rhythm: Normal rate and regular rhythm.     Heart sounds: No murmur.  Pulmonary:     Effort: Pulmonary effort is normal. No respiratory distress.     Breath sounds: Normal breath sounds. No wheezing or rales.  Chest:     Chest wall: No tenderness.  Musculoskeletal:        General: No tenderness.     Cervical back: Normal range of motion and neck supple.  Skin:    General: Skin is warm and dry.  Neurological:     Mental Status: He is oriented to person, place, and time.  Psychiatric:        Behavior: Behavior normal.        Thought Content: Thought content normal.        Judgment: Judgment normal.    BP 120/80 (BP Location: Right Arm, Patient Position: Sitting, Cuff Size: Normal)   Pulse 95   Temp 97.6 F (36.4 C) (Temporal)   Resp 18   Ht 6' (1.829 m)   Wt 198 lb 12.8 oz (90.2 kg)   SpO2 99%   BMI 26.96 kg/m  Wt Readings from Last 3 Encounters:  03/12/20 198 lb 12.8 oz (90.2 kg)  03/18/19 193 lb (87.5 kg)  03/11/19 193 lb 1  oz (87.6 kg)     Lab Results  Component Value Date   WBC 5.6 01/08/2018   HGB 16.2 01/08/2018   HCT 46.4 01/08/2018   PLT 217.0 01/08/2018   GLUCOSE 79 01/08/2018   CHOL 132 01/08/2018   TRIG 59.0 01/08/2018   HDL 39.00 (L) 01/08/2018   LDLCALC 81 01/08/2018   ALT 13 01/08/2018   AST 16 01/08/2018   NA 139 01/08/2018   K 3.7 01/08/2018   CL 103 01/08/2018   CREATININE 1.15 01/08/2018   BUN 14 01/08/2018   CO2 28 01/08/2018   HGBA1C 4.9 01/08/2018    No results found.   Assessment & Plan:  Plan  I have discontinued Tuff Beitzel's amoxicillin. I am also having him start on ofloxacin and cefdinir. Additionally, I am having him maintain his omeprazole.  Meds ordered this encounter  Medications  .  ofloxacin (FLOXIN OTIC) 0.3 % OTIC solution    Sig: Place 10 drops into both ears daily.    Dispense:  10 mL    Refill:  1  . cefdinir (OMNICEF) 300 MG capsule    Sig: Take 1 capsule (300 mg total) by mouth 2 (two) times daily.    Dispense:  14 capsule    Refill:  0    Problem List Items Addressed This Visit    None    Visit Diagnoses    Bilateral impacted cerumen    -  Primary   Non-recurrent acute serous otitis media of both ears       Relevant Medications   ofloxacin (FLOXIN OTIC) 0.3 % OTIC solution   cefdinir (OMNICEF) 300 MG capsule      Follow-up: Return in about 1 week (around 03/19/2020), or if symptoms worsen or fail to improve--- with pcp to recheck ears.  Alex Schultz, DO

## 2020-03-17 NOTE — Progress Notes (Signed)
[facility] [place], [place], [place] [address] [phone]  Date:  03/18/2020   Name:  Alex Marquez   DOB:  1996-08-21   MRN:  [id number]  PCP:  Darreld Mclean, MD    Chief Complaint: Ear Fullness (follow up, bilateral ear infection)   History of Present Illness:  Alex Marquez is a 24 y.o. very pleasant male patient who presents with the following:  Generally healthy young man with history of ADD Patient here today for short-term follow-up He was seen by Dr. Etter Sjogren on June 3-at that time he had ear discomfort, he was noted to have bilateral earwax which could not be completely removed He was started on Floxin otic drops and Omnicef-need to recheck ears today  Today Alex Marquez notes that he overall feels better, but his ears are still a bit stuffy especially on the left He is using his drops as an oral antibiotic  He otherwise feels well, no fever, sore throat, cough Patient Active Problem List   Diagnosis Date Noted  . Bilateral impacted cerumen 03/12/2020  . Esophageal reflux 12/22/2014    Past Medical History:  Diagnosis Date  . ADHD (attention deficit hyperactivity disorder)    pt states ADD  . Allergy   . GERD (gastroesophageal reflux disease)     Past Surgical History:  Procedure Laterality Date  . TONSILLECTOMY    . TOOTH EXTRACTION  2008    Social History   Tobacco Use  . Smoking status: Never Smoker  . Smokeless tobacco: Never Used  Substance Use Topics  . Alcohol use: Yes    Alcohol/week: 0.0 standard drinks    Comment: "not often"  . Drug use: No    Family History  Problem Relation Age of Onset  . Colon cancer Maternal Grandfather        great  . Colon polyps Mother   . Heart disease Maternal Grandmother   . Irritable bowel syndrome Other     No Known Allergies  Medication list has been reviewed and updated.  Current Outpatient Medications on File Prior to Visit   Medication Sig Dispense Refill  . cefdinir (OMNICEF) 300 MG capsule Take 1 capsule (300 mg total) by mouth 2 (two) times daily. 14 capsule 0  . ofloxacin (FLOXIN OTIC) 0.3 % OTIC solution Place 10 drops into both ears daily. 10 mL 1  . omeprazole (PRILOSEC) 20 MG capsule Take 1 capsule (20 mg total) by mouth daily before breakfast. 90 capsule 3   No current facility-administered medications on file prior to visit.    Review of Systems:  As per HPI- otherwise negative.   Physical Examination: Vitals:   03/18/20 1426  BP: 128/82  Pulse: 92  Resp: 16  Temp: 98.1 F (36.7 C)  SpO2: 97%   Vitals:   03/18/20 1426  Weight: 195 lb (88.5 kg)  Height: 6' (1.829 m)   Body mass index is 26.45 kg/m. Ideal Body Weight: Weight in (lb) to have BMI = 25: 183.9  GEN: no acute distress.  Well-appearing young man, normal weight HEENT: Atraumatic, Normocephalic.  Ears and Nose: No external deformity. CV: RRR, No M/G/R. No JVD. No thrill. No extra heart sounds. PULM: CTA B, no wheezes, crackles, rhonchi. No retractions. No resp. distress. No accessory muscle use. EXTR: No c/c/e PSYCH: Normally interactive. Conversant.  There is cerumen in both external ear canals.  This is removed with warm water irrigation, after irrigation  TM and ear canals are normal bilaterally.  Patient felt significant improvement in discomfort after irrigation   Assessment and Plan: Bilateral impacted cerumen  Bilateral cerumen impaction, resolved with irrigation as above.  Patient noted improvement in his symptoms after irrigation.  I did ask him to continue his Floxin otic drops for a few days to ensure no ear infection develops from getting ears wax.  Otherwise, I think his problem should be resolved.  He will let us know if any concerns  Signed Abbe Amsterdam, MD

## 2020-03-18 ENCOUNTER — Encounter: Payer: Self-pay | Admitting: Family Medicine

## 2020-03-18 ENCOUNTER — Other Ambulatory Visit: Payer: Self-pay

## 2020-03-18 ENCOUNTER — Ambulatory Visit: Payer: BC Managed Care – PPO | Admitting: Family Medicine

## 2020-03-18 VITALS — BP 128/82 | HR 92 | Temp 98.1°F | Resp 16 | Ht 72.0 in | Wt 195.0 lb

## 2020-03-18 DIAGNOSIS — H6123 Impacted cerumen, bilateral: Secondary | ICD-10-CM

## 2020-03-18 NOTE — Patient Instructions (Signed)
Your ears are cleaned out!  Let me know if any concerns.  Please continue to use the ear drops for another 3-4 days to prevent any infection

## 2020-09-26 ENCOUNTER — Other Ambulatory Visit: Payer: Self-pay | Admitting: Family Medicine

## 2020-09-26 DIAGNOSIS — K219 Gastro-esophageal reflux disease without esophagitis: Secondary | ICD-10-CM

## 2020-12-30 NOTE — Progress Notes (Addendum)
[facility] [address] [place], [place] [address] [phone]  Date:  12/31/2020   Name:  Alex Marquez   DOB:  03-20-1996   MRN:  [id number]  PCP:  Pearline Cables, MD    Chief Complaint: Medication Refill (omeprazole)   History of Present Illness:  Alex Marquez is a 25 y.o. very pleasant male patient who presents with the following:  Pt is here today for a medication follow-up History of ADD-not on any stimulant treatment Last seen by myself in June of 2021  covid series Flu vaccine BW- none in 3 years   He takes omeprazole on a regular basis. He has tried stopping the medication but he will get GERD sx after a few days.  He has to really watch his diet to avoid trigger foods when he is not taking omeprazole.  I advised him that omeprazole may adversely affect his bone density with long term use.  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 Bilateral impacted cerumen 03/12/2020  . Esophageal reflux 12/22/2014    Past Medical History:  Diagnosis Date  . ADHD (attention deficit hyperactivity disorder)    pt states ADD  . Allergy   . GERD (gastroesophageal reflux disease)     Past Surgical History:  Procedure Laterality Date  . TONSILLECTOMY    . TOOTH EXTRACTION  2008    Social  History   Tobacco Use  . Smoking status: Never Smoker  . Smokeless tobacco: Never Used  Vaping Use  . Vaping Use: Never used  Substance Use Topics  . Alcohol use: Yes    Alcohol/week: 0.0 standard drinks    Comment: "not often"  . Drug use: No    Family History  Problem Relation Age of Onset  . Colon cancer Maternal Grandfather        great  . Colon polyps Mother   . Heart disease Maternal Grandmother   . Irritable bowel syndrome Other     No Known Allergies  Medication list has been reviewed and updated.  Current Outpatient Medications on File Prior to Visit  Medication Sig Dispense Refill  . omeprazole (PRILOSEC) 20 MG capsule TAKE 1 CAPSULE BY MOUTH DAILY BEFORE BREAKFAST 90 capsule 3   No current facility-administered medications on file prior to visit.    Review of Systems:  As per HPI- otherwise negative.   Physical Examination: Vitals:   12/31/20 1555  BP: 122/88  Pulse: 78  Resp: 18  SpO2: 98%   Vitals:   12/31/20 1555  Weight: 214 lb (97.1 kg)  Height: 6' (1.829 m)   Body mass index is 29.02 kg/m. Ideal Body Weight: Weight  in (lb) to have BMI = 25: 183.9  GEN: no acute distress.  Mild overweight, looks well  HEENT: Atraumatic, Normocephalic.  Bilateral TM wnl, oropharynx normal.  PEERL,EOMI.   Ears and Nose: No external deformity. CV: RRR, No M/G/R. No JVD. No thrill. No extra heart sounds. PULM: CTA B, no wheezes, crackles, rhonchi. No retractions. No resp. distress. No accessory muscle use. ABD: S, NT, ND, +BS. No rebound. No HSM. EXTR: No c/c/e PSYCH: Normally interactive. Conversant.   EKG: short PR, otherwise normal  Assessment and Plan: Physical exam  Palpitations - Plan: TSH, EKG 12-Lead  Screening for deficiency anemia - Plan: CBC  Screening for diabetes mellitus - Plan: Comprehensive metabolic panel, Hemoglobin A1c  Screening for thyroid disorder - Plan: TSH  Screening, lipid - Plan: Lipid panel  Gastroesophageal reflux  disease, unspecified whether esophagitis present - Plan: omeprazole (PRILOSEC) 20 MG capsule  Here today for a CPE Labs pending as above- Will plan further follow- up pending labs. We discussed his cardiac symptoms and EKG.  I advised that his EKG is read as abnormal by the machine, but we do not have any old tracings for comparison.  I recommended doing an echocardiogram to rule out any structural abnormality, for the time being he declines.  He also declines to see cardiology at this time.  He will let me know if his symptoms change or start occurring more frequently   This visit occurred during the SARS-CoV-2 public health emergency.  Safety protocols were in place, including screening questions prior to the visit, additional usage of staff PPE, and extensive cleaning of exam room while observing appropriate contact time as indicated for disinfecting solutions.    Signed Abbe Amsterdam, MD  Received his labs as below 3/25- message to pt  Results for orders placed or performed in visit on 12/31/20  CBC  Result Value Ref Range   WBC 6.6 4.0 - 10.5 K/uL   RBC 5.30 4.22 - 5.81 Mil/uL   Platelets 222.0 150.0 - 400.0 K/uL   Hemoglobin 16.1 13.0 - 17.0 g/dL   HCT 49.4 49.6 - 75.9 %   MCV 86.4 78.0 - 100.0 fl   MCHC 35.1 30.0 - 36.0 g/dL   RDW 16.3 84.6 - 65.9 %  Comprehensive metabolic panel  Result Value Ref Range   Sodium 140 135 - 145 mEq/L   Potassium 4.9 3.5 - 5.1 mEq/L   Chloride 105 96 - 112 mEq/L   CO2 28 19 - 32 mEq/L   Glucose, Bld 97 70 - 99 mg/dL   BUN 18 6 - 23 mg/dL   Creatinine, Ser 9.35 0.40 - 1.50 mg/dL   Total Bilirubin 0.8 0.2 - 1.2 mg/dL   Alkaline Phosphatase 75 39 - 117 U/L   AST 15 0 - 37 U/L   ALT 12 0 - 53 U/L   Total Protein 6.8 6.0 - 8.3 g/dL   Albumin 4.8 3.5 - 5.2 g/dL   GFR 70.17 >79.39 mL/min   Calcium 9.4 8.4 - 10.5 mg/dL  Hemoglobin Q3E  Result Value Ref Range   Hgb A1c MFr Bld 5.0 4.6 - 6.5 %  Lipid panel  Result Value Ref Range    Cholesterol 137 0 - 200 mg/dL   Triglycerides 092.3 0.0 - 149.0 mg/dL   HDL 30.07 (L) >62.26 mg/dL   VLDL 33.3 0.0 - 54.5 mg/dL   LDL Cholesterol 82 0 - 99 mg/dL   Total CHOL/HDL Ratio 4    NonHDL 104.96  TSH  Result Value Ref Range   TSH 1.46 0.35 - 4.50 uIU/mL

## 2020-12-31 ENCOUNTER — Encounter: Payer: Self-pay | Admitting: Family Medicine

## 2020-12-31 ENCOUNTER — Other Ambulatory Visit: Payer: Self-pay

## 2020-12-31 ENCOUNTER — Ambulatory Visit: Payer: Managed Care, Other (non HMO) | Admitting: Family Medicine

## 2020-12-31 VITALS — BP 122/88 | HR 78 | Resp 18 | Ht 72.0 in | Wt 214.0 lb

## 2020-12-31 DIAGNOSIS — K219 Gastro-esophageal reflux disease without esophagitis: Secondary | ICD-10-CM

## 2020-12-31 DIAGNOSIS — Z131 Encounter for screening for diabetes mellitus: Secondary | ICD-10-CM | POA: Diagnosis not present

## 2020-12-31 DIAGNOSIS — Z0000 Encounter for general adult medical examination without abnormal findings: Secondary | ICD-10-CM | POA: Diagnosis not present

## 2020-12-31 DIAGNOSIS — Z130 Encounter for screening for diseases of the blood and blood-forming organs and certain disorders involving the immune mechanism: Secondary | ICD-10-CM | POA: Diagnosis not present

## 2020-12-31 DIAGNOSIS — Z13220 Encounter for screening for lipoid disorders: Secondary | ICD-10-CM

## 2020-12-31 DIAGNOSIS — R002 Palpitations: Secondary | ICD-10-CM | POA: Diagnosis not present

## 2020-12-31 DIAGNOSIS — Z1329 Encounter for screening for other suspected endocrine disorder: Secondary | ICD-10-CM

## 2020-12-31 MED ORDER — OMEPRAZOLE 20 MG PO CPDR: DELAYED_RELEASE_CAPSULE | ORAL | 3 refills | Status: DC

## 2020-12-31 NOTE — Patient Instructions (Addendum)
It was great to see you today!  Take care and I will be in touch with your labs Let me know if you continue to have any of these cardiac episodes or if they are becoming more frequent Please get your covid vaccines if not done already, and take care!     Health Maintenance, Male Adopting a healthy lifestyle and getting preventive care are important in promoting health and wellness. Ask your health care provider about:  The right schedule for you to have regular tests and exams.  Things you can do on your own to prevent diseases and keep yourself healthy. What should I know about diet, weight, and exercise? Eat a healthy diet  Eat a diet that includes plenty of vegetables, fruits, low-fat dairy products, and lean protein.  Do not eat a lot of foods that are high in solid fats, added sugars, or sodium.   Maintain a healthy weight Body mass index (BMI) is a measurement that can be used to identify possible weight problems. It estimates body fat based on height and weight. Your health care provider can help determine your BMI and help you achieve or maintain a healthy weight. Get regular exercise Get regular exercise. This is one of the most important things you can do for your health. Most adults should:  Exercise for at least 150 minutes each week. The exercise should increase your heart rate and make you sweat (moderate-intensity exercise).  Do strengthening exercises at least twice a week. This is in addition to the moderate-intensity exercise.  Spend less time sitting. Even light physical activity can be beneficial. Watch cholesterol and blood lipids Have your blood tested for lipids and cholesterol at 25 years of age, then have this test every 5 years. You may need to have your cholesterol levels checked more often if:  Your lipid or cholesterol levels are high.  You are older than 25 years of age.  You are at high risk for heart disease. What should I know about cancer  screening? Many types of cancers can be detected early and may often be prevented. Depending on your health history and family history, you may need to have cancer screening at various ages. This may include screening for:  Colorectal cancer.  Prostate cancer.  Skin cancer.  Lung cancer. What should I know about heart disease, diabetes, and high blood pressure? Blood pressure and heart disease  High blood pressure causes heart disease and increases the risk of stroke. This is more likely to develop in people who have high blood pressure readings, are of African descent, or are overweight.  Talk with your health care provider about your target blood pressure readings.  Have your blood pressure checked: ? Every 3-5 years if you are 41-8 years of age. ? Every year if you are 65 years old or older.  If you are between the ages of 17 and 39 and are a current or former smoker, ask your health care provider if you should have a one-time screening for abdominal aortic aneurysm (AAA). Diabetes Have regular diabetes screenings. This checks your fasting blood sugar level. Have the screening done:  Once every three years after age 73 if you are at a normal weight and have a low risk for diabetes.  More often and at a younger age if you are overweight or have a high risk for diabetes. What should I know about preventing infection? Hepatitis B If you have a higher risk for hepatitis B, you should be  screened for this virus. Talk with your health care provider to find out if you are at risk for hepatitis B infection. Hepatitis C Blood testing is recommended for:  Everyone born from [birth year] through 1965.  Anyone with known risk factors for hepatitis C. Sexually transmitted infections (STIs)  You should be screened each year for STIs, including gonorrhea and chlamydia, if: ? You are sexually active and are younger than 25 years of age. ? You are older than 25 years of age and your health care  provider tells you that you are at risk for this type of infection. ? Your sexual activity has changed since you were last screened, and you are at increased risk for chlamydia or gonorrhea. Ask your health care provider if you are at risk.  Ask your health care provider about whether you are at high risk for HIV. Your health care provider may recommend a prescription medicine to help prevent HIV infection. If you choose to take medicine to prevent HIV, you should first get tested for HIV. You should then be tested every 3 months for as long as you are taking the medicine. Follow these instructions at home: Lifestyle  Do not use any products that contain nicotine or tobacco, such as cigarettes, e-cigarettes, and chewing tobacco. If you need help quitting, ask your health care provider.  Do not use street drugs.  Do not share needles.  Ask your health care provider for help if you need support or information about quitting drugs. Alcohol use  Do not drink alcohol if your health care provider tells you not to drink.  If you drink alcohol: ? Limit how much you have to 0-2 drinks a day. ? Be aware of how much alcohol is in your drink. In the [place] one drink equals one 12 oz bottle of beer (355 mL), one 5 oz glass of wine (148 mL), or one 1 oz glass of hard liquor (44 mL). General instructions  Schedule regular health, dental, and eye exams.  Stay current with your vaccines.  Tell your health care provider if: ? You often feel depressed. ? You have ever been abused or do not feel safe at home. Summary  Adopting a healthy lifestyle and getting preventive care are important in promoting health and wellness.  Follow your health care provider's instructions about healthy diet, exercising, and getting tested or screened for diseases.  Follow your health care provider's instructions on monitoring your cholesterol and blood pressure. This information is not intended to replace advice given  to you by your health care provider. Make sure you discuss any questions you have with your health care provider. Document Revised: 09/19/2018 Document Reviewed: 09/19/2018 Elsevier Patient Education  2021 [employer].

## 2021-01-01 LAB — COMPREHENSIVE METABOLIC PANEL
ALT: 12 U/L (ref 0–53)
AST: 15 U/L (ref 0–37)
Albumin: 4.8 g/dL (ref 3.5–5.2)
Alkaline Phosphatase: 75 U/L (ref 39–117)
BUN: 18 mg/dL (ref 6–23)
CO2: 28 mEq/L (ref 19–32)
Calcium: 9.4 mg/dL (ref 8.4–10.5)
Chloride: 105 mEq/L (ref 96–112)
Creatinine, Ser: 1.39 mg/dL (ref 0.40–1.50)
GFR: 70.9 mL/min (ref >60.00)
Glucose, Bld: 97 mg/dL (ref 70–99)
Potassium: 4.9 mEq/L (ref 3.5–5.1)
Sodium: 140 mEq/L (ref 135–145)
Total Bilirubin: 0.8 mg/dL (ref 0.2–1.2)
Total Protein: 6.8 g/dL (ref 6.0–8.3)

## 2021-01-01 LAB — LIPID PANEL
Cholesterol: 137 mg/dL (ref 0–200)
HDL: 32.3 mg/dL — ABNORMAL LOW (ref >39.00)
LDL Cholesterol: 82 mg/dL (ref 0–99)
NonHDL: 104.96
Total CHOL/HDL Ratio: 4
Triglycerides: 114 mg/dL (ref 0.0–149.0)
VLDL: 22.8 mg/dL (ref 0.0–40.0)

## 2021-01-01 LAB — HEMOGLOBIN A1C: Hgb A1c MFr Bld: 5 % (ref 4.6–6.5)

## 2021-01-01 LAB — CBC
HCT: 45.8 % (ref 39.0–52.0)
Hemoglobin: 16.1 g/dL (ref 13.0–17.0)
MCHC: 35.1 g/dL (ref 30.0–36.0)
MCV: 86.4 fl (ref 78.0–100.0)
Platelets: 222 10*3/uL (ref 150.0–400.0)
RBC: 5.3 Mil/uL (ref 4.22–5.81)
RDW: 12.6 % (ref 11.5–15.5)
WBC: 6.6 10*3/uL (ref 4.0–10.5)

## 2021-01-01 LAB — TSH: TSH: 1.46 u[IU]/mL (ref 0.35–4.50)

## 2021-01-04 ENCOUNTER — Telehealth: Payer: Self-pay | Admitting: Family Medicine

## 2021-01-04 NOTE — Telephone Encounter (Signed)
Patient states that during his visit last week  He was told to consider having a scan done on his heart due to his recent cardiac episodes. Please advise once order has been placed for him

## 2021-01-05 ENCOUNTER — Other Ambulatory Visit: Payer: Self-pay | Admitting: Family Medicine

## 2021-01-05 DIAGNOSIS — R9431 Abnormal electrocardiogram [ECG] [EKG]: Secondary | ICD-10-CM

## 2021-01-05 DIAGNOSIS — R002 Palpitations: Secondary | ICD-10-CM

## 2021-01-05 NOTE — Telephone Encounter (Signed)
Called pt and advised of order for an echo, pt voiced understanding. -Jma

## 2021-01-05 NOTE — Telephone Encounter (Signed)
Echo is ordered.  Please let pt know.  If he does not hear about his appt by Friday please alert me

## 2021-01-11 ENCOUNTER — Ambulatory Visit (HOSPITAL_COMMUNITY): Payer: Managed Care, Other (non HMO) | Attending: Internal Medicine

## 2021-01-11 ENCOUNTER — Other Ambulatory Visit: Payer: Self-pay

## 2021-01-11 DIAGNOSIS — R002 Palpitations: Secondary | ICD-10-CM | POA: Diagnosis not present

## 2021-01-11 DIAGNOSIS — R9431 Abnormal electrocardiogram [ECG] [EKG]: Secondary | ICD-10-CM | POA: Insufficient documentation

## 2021-01-11 LAB — ECHOCARDIOGRAM COMPLETE
Area-P 1/2: 4.15 cm2
S' Lateral: 2.9 cm

## 2021-01-12 ENCOUNTER — Encounter: Payer: Self-pay | Admitting: Family Medicine

## 2021-01-15 ENCOUNTER — Telehealth: Payer: Self-pay | Admitting: Family Medicine

## 2021-01-15 NOTE — Telephone Encounter (Signed)
Discussed results with patient

## 2021-01-15 NOTE — Telephone Encounter (Signed)
Pt called to get result for his Echcardio he did Monday  Please  advice

## 2021-12-09 DIAGNOSIS — M7672 Peroneal tendinitis, left leg: Secondary | ICD-10-CM | POA: Diagnosis not present

## 2021-12-14 DIAGNOSIS — M25572 Pain in left ankle and joints of left foot: Secondary | ICD-10-CM | POA: Diagnosis not present

## 2021-12-14 DIAGNOSIS — M545 Low back pain, unspecified: Secondary | ICD-10-CM | POA: Diagnosis not present

## 2021-12-21 DIAGNOSIS — M545 Low back pain, unspecified: Secondary | ICD-10-CM | POA: Diagnosis not present

## 2021-12-23 DIAGNOSIS — M545 Low back pain, unspecified: Secondary | ICD-10-CM | POA: Diagnosis not present

## 2021-12-28 DIAGNOSIS — M545 Low back pain, unspecified: Secondary | ICD-10-CM | POA: Diagnosis not present

## 2021-12-31 DIAGNOSIS — M545 Low back pain, unspecified: Secondary | ICD-10-CM | POA: Diagnosis not present

## 2022-01-03 DIAGNOSIS — M545 Low back pain, unspecified: Secondary | ICD-10-CM | POA: Diagnosis not present

## 2022-01-05 DIAGNOSIS — M545 Low back pain, unspecified: Secondary | ICD-10-CM | POA: Diagnosis not present

## 2022-01-11 DIAGNOSIS — M545 Low back pain, unspecified: Secondary | ICD-10-CM | POA: Diagnosis not present

## 2022-01-12 DIAGNOSIS — M545 Low back pain, unspecified: Secondary | ICD-10-CM | POA: Diagnosis not present

## 2022-01-17 DIAGNOSIS — M545 Low back pain, unspecified: Secondary | ICD-10-CM | POA: Diagnosis not present

## 2022-01-19 DIAGNOSIS — M545 Low back pain, unspecified: Secondary | ICD-10-CM | POA: Diagnosis not present

## 2022-01-26 DIAGNOSIS — M545 Low back pain, unspecified: Secondary | ICD-10-CM | POA: Diagnosis not present

## 2022-01-28 DIAGNOSIS — M545 Low back pain, unspecified: Secondary | ICD-10-CM | POA: Diagnosis not present

## 2022-02-02 DIAGNOSIS — M545 Low back pain, unspecified: Secondary | ICD-10-CM | POA: Diagnosis not present

## 2022-03-20 NOTE — Progress Notes (Unsigned)
[facility] [address] [place], [place] [address] [phone]  Date:  03/21/2022   Name:  Alex Marquez   DOB:  Jun 18, 1996   MRN:  [id number]  PCP:  Pearline Cables, MD    Chief Complaint: No chief complaint on file.   History of Present Illness:  Alex Marquez is a 26 y.o. very pleasant male patient who presents with the following:  Patient seen today for follow-up Most recent visit with myself was in March 2022 for physical exam History of ADD, not currently on any stimulant He takes omeprazole chronically for GERD At last visit he had can concern of palpitations-EKG overall reassuring Also had an echocardiogram  1. Left ventricular ejection fraction, by estimation, is 65 to 70%. The  left ventricle has normal function. The left ventricle has no regional  wall motion abnormalities. Left ventricular diastolic parameters were  normal.   2. Right ventricular systolic function is normal. The right ventricular  size is normal.   3. The mitral valve is normal in structure. Trivial mitral valve  regurgitation.   4. The aortic valve is normal in structure. Aortic valve regurgitation is  not visualized.   Third dose Gardasil Most recent labs 3/22 Patient Active Problem List   Diagnosis Date Noted   Bilateral impacted cerumen 03/12/2020   Esophageal reflux 12/22/2014    Past Medical History:  Diagnosis Date   ADHD (attention deficit hyperactivity disorder)    pt states ADD   Allergy    GERD (gastroesophageal reflux disease)     Past Surgical History:  Procedure Laterality Date   TONSILLECTOMY     TOOTH EXTRACTION  2008    Social History   Tobacco Use   Smoking status: Never   Smokeless tobacco: Never  Vaping Use   Vaping Use: Never used  Substance Use Topics   Alcohol use: Yes    Alcohol/week: 0.0 standard drinks of alcohol    Comment: "not often"   Drug use: No    Family History   Problem Relation Age of Onset   Colon cancer Maternal Grandfather        great   Colon polyps Mother    Heart disease Maternal Grandmother    Irritable bowel syndrome Other     No Known Allergies  Medication list has been reviewed and updated.  Current Outpatient Medications on File Prior to Visit  Medication Sig Dispense Refill   omeprazole (PRILOSEC) 20 MG capsule TAKE 1 CAPSULE BY MOUTH DAILY BEFORE BREAKFAST 90 capsule 3   No current facility-administered medications on file prior to visit.    Review of Systems:  As per HPI- otherwise negative.   Physical Examination: There were no vitals filed for this visit. There were no vitals filed for this visit. There is no height or weight on file to calculate BMI. Ideal Body Weight:    GEN: no acute distress. HEENT: Atraumatic, Normocephalic.  Ears and Nose: No external deformity. CV: RRR, No M/G/R. No JVD. No thrill. No extra heart sounds. PULM: CTA B, no wheezes, crackles, rhonchi. No retractions. No resp. distress. No accessory muscle use. ABD: S, NT, ND, +BS. No rebound. No HSM. EXTR: No c/c/e PSYCH: Normally interactive. Conversant.    Assessment and Plan: ***  Signed Abbe Amsterdam, MD

## 2022-03-21 ENCOUNTER — Ambulatory Visit: Payer: BC Managed Care – PPO | Admitting: Family Medicine

## 2022-03-21 DIAGNOSIS — M545 Low back pain, unspecified: Secondary | ICD-10-CM | POA: Diagnosis not present

## 2022-03-21 DIAGNOSIS — K219 Gastro-esophageal reflux disease without esophagitis: Secondary | ICD-10-CM

## 2022-03-21 MED ORDER — OMEPRAZOLE 40 MG PO CPDR: DELAYED_RELEASE_CAPSULE | ORAL | 3 refills | Status: DC

## 2022-04-06 ENCOUNTER — Ambulatory Visit: Payer: BC Managed Care – PPO | Admitting: Medical

## 2022-04-06 VITALS — BP 116/70 | HR 77 | Resp 18 | Ht 72.0 in | Wt 213.6 lb

## 2022-04-06 DIAGNOSIS — R1013 Epigastric pain: Secondary | ICD-10-CM | POA: Diagnosis not present

## 2022-04-06 DIAGNOSIS — R109 Unspecified abdominal pain: Secondary | ICD-10-CM

## 2022-04-06 DIAGNOSIS — R112 Nausea with vomiting, unspecified: Secondary | ICD-10-CM | POA: Diagnosis not present

## 2022-04-06 DIAGNOSIS — F419 Anxiety disorder, unspecified: Secondary | ICD-10-CM

## 2022-04-06 DIAGNOSIS — K580 Irritable bowel syndrome with diarrhea: Secondary | ICD-10-CM

## 2022-04-06 DIAGNOSIS — K219 Gastro-esophageal reflux disease without esophagitis: Secondary | ICD-10-CM | POA: Diagnosis not present

## 2022-04-06 MED ORDER — ONDANSETRON HCL 4 MG PO TABS: 4.0000 mg | ORAL_TABLET | Freq: Three times a day (TID) | ORAL | 0 refills | Status: DC | PRN

## 2022-04-06 MED ORDER — FAMOTIDINE 20 MG PO TABS: 20.0000 mg | ORAL_TABLET | Freq: Every day | ORAL | 0 refills | Status: DC

## 2022-04-06 NOTE — Progress Notes (Signed)
Subjective:    Patient ID: Alex Marquez, male    DOB: 10-27-1995, 26 y.o.   MRN: [id number]  HPI  Pt in for abdomen pain. Pt states had problems with stomach for years. States has reflux.   Monday morning he woke up and vomited. Also states recently if gets nervous will vomit. No nausea presently. Pt stats last time drank was 3-4 weeks. Drinks at most once a month. 1-2 beers or shot of whiskey.  Pt also states in past if vomit stomach pain will resolve. Describe if anxious with date and having abd pain can vomit one time and symptoms will resolve.   Pt never had egd. Pt has seen Dr. Leone Payor in the past.  Pt saw Dr. Patsy Lager one month ago. She increased omeprazole to 40 mg daily.  He states omeprazole did help with pain after eating but not random vomiting episodes associated with nervousness.  On and off variation of stool consistency. Describes random one loose stool in am and next day normal. Loose stools often if nervous about.  On review pt is not diabetic.   Review of Systems  Constitutional:  Negative for chills, fatigue and fever.  Respiratory:  Negative for cough, chest tightness, shortness of breath and wheezing.   Cardiovascular:  Negative for chest pain and palpitations.  Gastrointestinal:  Negative for abdominal distention, abdominal pain, blood in stool, constipation and diarrhea.       None currently but see hpi.  Genitourinary:  Negative for dysuria, flank pain and frequency.  Musculoskeletal:  Negative for back pain and myalgias.  Skin:  Positive for rash.  Neurological:  Negative for dizziness, speech difficulty, light-headedness and headaches.  Hematological:  Negative for adenopathy. Does not bruise/bleed easily.  Psychiatric/Behavioral:  Negative for behavioral problems and confusion.     Past Medical History:  Diagnosis Date   ADHD (attention deficit hyperactivity disorder)    pt states ADD   Allergy    GERD (gastroesophageal reflux disease)       Social History   Socioeconomic History   Marital status: Single    Spouse name: Not on file   Number of children: 0   Years of education: Not on file   Highest education level: Not on file  Occupational History   Occupation: student  Tobacco Use   Smoking status: Never   Smokeless tobacco: Never  Vaping Use   Vaping Use: Never used  Substance and Sexual Activity   Alcohol use: Yes    Alcohol/week: 0.0 standard drinks of alcohol    Comment: "not often"   Drug use: No   Sexual activity: Not on file  Other Topics Concern   Not on file  Social History Narrative   Single, lives with his parents. 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   One to 2 glasses of tea daily as far as caffeine is concerned.   Social Determinants of Health   Financial Resource Strain: Not on file  Food Insecurity: Not on file  Transportation Needs: Not on file  Physical Activity: Not on file  Stress: Not on file  Social Connections: Not on file  Intimate Partner Violence: Not on file    Past Surgical History:  Procedure Laterality Date   TONSILLECTOMY     TOOTH EXTRACTION  2008    Family History  Problem Relation Age of Onset   Colon cancer Maternal Grandfather  great   Colon polyps Mother    Heart disease Maternal Grandmother    Irritable bowel syndrome Other     No Known Allergies  Current Outpatient Medications on File Prior to Visit  Medication Sig Dispense Refill   omeprazole (PRILOSEC) 40 MG capsule TAKE 1 CAPSULE BY MOUTH DAILY BEFORE BREAKFAST 90 capsule 3   No current facility-administered medications on file prior to visit.    BP 116/70   Pulse 77   Resp 18   Ht 6' (1.829 m)   Wt 213 lb 9.6 oz (96.9 kg)   SpO2 100%   BMI 28.97 kg/m        Objective:   Physical Exam   General Mental Status- Alert. General Appearance- Not in acute distress.   Skin General: Color-  Normal Color. Moisture- Normal Moisture.  Neck Carotid Arteries- Normal color. Moisture- Normal Moisture. No carotid bruits. No JVD.  Chest and Lung Exam Auscultation: Breath Sounds:-Normal.  Cardiovascular Auscultation:Rythm- Regular. Murmurs & Other Heart Sounds:Auscultation of the heart reveals- No Murmurs.  Abdomen Inspection:-Inspeection Normal. Palpation/Percussion:Note:No mass. Palpation and Percussion of the abdomen reveal- Non Tender, Non Distended + BS, no rebound or guarding.   Neurologic Cranial Nerve exam:- CN III-XII intact(No nystagmus), symmetric smile. Strength:- 5/5 equal and symmetric strength both upper and lower extremities.      Assessment & Plan:   Patient Instructions  GERD with a recent flare but some improved after increasing omeprazole to 40 mg daily.  If you find occasionally pain after eating despite increase of omeprazole dose then can add on famotidine 20 mg daily.  That Rx was sent to your pharmacy.  Intermittent random episodes of nausea, vomiting and some loose stools that occur with feeling stressed.  If more than 1 vomiting event then making Zofran available.  Loose stools and potential stomach cramping component makes me consider IBS.  We will get CBC, CMP and lipase today.  Since you do have a long history of reflux with other described symptoms as well do think it is a good idea to refer you back to GI MD Dr. Leone Payor.  You may benefit from EGD and other studies.  You could call back to GI MD office Tuesday or Wednesday of next week to see you might be able to be sooner than what they offered you recently.  With your above symptoms being worse with stress I decided to do GAD-7 screening test.  He had a lower and score of 6.  If you feel like stress is worsening causing anxiety might consider offering SSRI or a medication called BuSpar.  Let me know if you feel like this could be helpful/want to try.  Follow-up in 3 weeks with PCP or  sooner if needed.    Esperanza Richters, PA-C

## 2022-04-06 NOTE — Patient Instructions (Addendum)
GERD with a recent flare but some improvement after increasing omeprazole to 40 mg daily.  If you find occasionally pain after eating despite increase of omeprazole dose then can add on famotidine 20 mg daily.  That Rx was sent to your pharmacy.  Intermittent random episodes of nausea, vomiting and some loose stools that occur with feeling stressed.  If more than 1 vomiting event then making Zofran available.  Loose stools and potential stomach cramping component makes me consider IBS.  We will get CBC, CMP and lipase today.  Since you do have a long history of reflux with other described symptoms as well do think it is a good idea to refer you back to GI MD Dr. Leone Payor.  You may benefit from EGD and other studies.  You could call back to GI MD office Tuesday or Wednesday of next week to see you might be able to be sooner than what they offered you recently.  With your above symptoms being worse with stress I decided to do GAD-7 screening test.  He had a lower and score of 6.  If you feel like stress is worsening causing anxiety might consider offering SSRI or a medication called BuSpar.  Let me know if you feel like this could be helpful/want to try.  Follow-up in 3 weeks with PCP or sooner if needed.

## 2022-04-07 ENCOUNTER — Encounter: Payer: Self-pay | Admitting: Physician Assistant

## 2022-04-07 LAB — CBC WITH DIFFERENTIAL/PLATELET
Basophils Absolute: 0 10*3/uL (ref 0.0–0.1)
Basophils Relative: 0.4 % (ref 0.0–3.0)
Eosinophils Absolute: 0.1 10*3/uL (ref 0.0–0.7)
Eosinophils Relative: 2.6 % (ref 0.0–5.0)
HCT: 45.7 % (ref 39.0–52.0)
Hemoglobin: 15.5 g/dL (ref 13.0–17.0)
Lymphocytes Relative: 20.4 % (ref 12.0–46.0)
Lymphs Abs: 1.1 10*3/uL (ref 0.7–4.0)
MCHC: 34 g/dL (ref 30.0–36.0)
MCV: 87.8 fl (ref 78.0–100.0)
Monocytes Absolute: 0.4 10*3/uL (ref 0.1–1.0)
Monocytes Relative: 7.5 % (ref 3.0–12.0)
Neutro Abs: 3.8 10*3/uL (ref 1.4–7.7)
Neutrophils Relative %: 69.1 % (ref 43.0–77.0)
Platelets: 180 10*3/uL (ref 150.0–400.0)
RBC: 5.21 Mil/uL (ref 4.22–5.81)
RDW: 12.8 % (ref 11.5–15.5)
WBC: 5.5 10*3/uL (ref 4.0–10.5)

## 2022-04-07 LAB — COMPREHENSIVE METABOLIC PANEL
ALT: 11 U/L (ref 0–53)
AST: 13 U/L (ref 0–37)
Albumin: 4.7 g/dL (ref 3.5–5.2)
Alkaline Phosphatase: 79 U/L (ref 39–117)
BUN: 15 mg/dL (ref 6–23)
CO2: 27 mEq/L (ref 19–32)
Calcium: 9.3 mg/dL (ref 8.4–10.5)
Chloride: 102 mEq/L (ref 96–112)
Creatinine, Ser: 1.22 mg/dL (ref 0.40–1.50)
GFR: 82.18 mL/min (ref >60.00)
Glucose, Bld: 92 mg/dL (ref 70–99)
Potassium: 3.9 mEq/L (ref 3.5–5.1)
Sodium: 137 mEq/L (ref 135–145)
Total Bilirubin: 1 mg/dL (ref 0.2–1.2)
Total Protein: 6.5 g/dL (ref 6.0–8.3)

## 2022-04-07 LAB — LIPASE: Lipase: 12 U/L (ref 11.0–59.0)

## 2022-04-24 NOTE — Progress Notes (Unsigned)
[facility] [address] [place], [place] [address] [phone]  Date:  04/27/2022   Name:  Alex Marquez   DOB:  [birth year]   MRN:  [id number]  PCP:  Pearline Cables, MD    Chief Complaint: No chief complaint on file.   History of Present Illness:  Alex Marquez is a 25 y.o. very pleasant male patient who presents with the following:  Patient seen today for follow-up Seen recently by myself and also Ramon Dredge for GERD and epigastric pain History of ADD, not on a stimulant Today increased omeprazole from 20-40 Patient Active Problem List   Diagnosis Date Noted   Bilateral impacted cerumen 03/12/2020   Esophageal reflux 12/22/2014    Past Medical History:  Diagnosis Date   ADHD (attention deficit hyperactivity disorder)    pt states ADD   Allergy    GERD (gastroesophageal reflux disease)     Past Surgical History:  Procedure Laterality Date   TONSILLECTOMY     TOOTH EXTRACTION  2008    Social History   Tobacco Use   Smoking status: Never   Smokeless tobacco: Never  Vaping Use   Vaping Use: Never used  Substance Use Topics   Alcohol use: Yes    Alcohol/week: 0.0 standard drinks of alcohol    Comment: "not often"   Drug use: No    Family History  Problem Relation Age of Onset   Colon cancer Maternal Grandfather        great   Colon polyps Mother    Heart disease Maternal Grandmother    Irritable bowel syndrome Other     No Known Allergies  Medication list has been reviewed and updated.  Current Outpatient Medications on File Prior to Visit  Medication Sig Dispense Refill   famotidine (PEPCID) 20 MG tablet Take 1 tablet (20 mg total) by mouth daily. 30 tablet 0   omeprazole (PRILOSEC) 40 MG capsule TAKE 1 CAPSULE BY MOUTH DAILY BEFORE BREAKFAST 90 capsule 3   ondansetron (ZOFRAN) 4 MG tablet Take 1 tablet (4 mg total) by mouth every 8 (eight) hours as needed for nausea or vomiting. 20  tablet 0   No current facility-administered medications on file prior to visit.    Review of Systems:  As per HPI- otherwise negative.   Physical Examination: There were no vitals filed for this visit. There were no vitals filed for this visit. There is no height or weight on file to calculate BMI. Ideal Body Weight:    GEN: no acute distress. HEENT: Atraumatic, Normocephalic.  Ears and Nose: No external deformity. CV: RRR, No M/G/R. No JVD. No thrill. No extra heart sounds. PULM: CTA B, no wheezes, crackles, rhonchi. No retractions. No resp. distress. No accessory muscle use. ABD: S, NT, ND, +BS. No rebound. No HSM. EXTR: No c/c/e PSYCH: Normally interactive. Conversant.    Assessment and Plan: ***  Signed Abbe Amsterdam, MD

## 2022-04-27 ENCOUNTER — Ambulatory Visit: Payer: BC Managed Care – PPO | Admitting: Family Medicine

## 2022-04-27 VITALS — BP 120/78 | HR 88 | Temp 97.9°F | Resp 18 | Ht 72.0 in | Wt 213.6 lb

## 2022-04-27 DIAGNOSIS — H6122 Impacted cerumen, left ear: Secondary | ICD-10-CM

## 2022-04-27 DIAGNOSIS — K219 Gastro-esophageal reflux disease without esophagitis: Secondary | ICD-10-CM | POA: Diagnosis not present

## 2022-04-27 NOTE — Patient Instructions (Signed)
Good to see you today- take care  Please do be sure to see GI; they may want to do an upper scope to check the condition of your esophagus and stomach

## 2022-04-27 NOTE — Progress Notes (Signed)
[facility] [address] [place], [place] [address] [phone]  Date:  04/27/2022   Name:  Alex Marquez   DOB:  [birth year]   MRN:  [id number]  PCP:  Pearline Cables, MD    Chief Complaint: 3 week follow up Genella Rife: taking Omeprazole 40 mg./Concerns/ questions: would like his ears checked today. )   History of Present Illness:  Alex Marquez is a 26 y.o. very pleasant male patient who presents with the following:  Patient seen today for follow-up Seen recently by myself and also Ramon Dredge for GERD and epigastric pain History of ADD, not on a stimulant At most recent visit increased omeprazole from 20-40  He has been on omeprazole for more than 5 years  He will be seen by GI later on this month He notes that his GERD sx are improved, however he may need an upper GI due to long history of persistent GERD  He is concerned about earwax- uses debrox at home which does help    Patient Active Problem List   Diagnosis Date Noted   Bilateral impacted cerumen 03/12/2020   Esophageal reflux 12/22/2014    Past Medical History:  Diagnosis Date   ADHD (attention deficit hyperactivity disorder)    pt states ADD   Allergy    GERD (gastroesophageal reflux disease)     Past Surgical History:  Procedure Laterality Date   TONSILLECTOMY     TOOTH EXTRACTION  2008    Social History   Tobacco Use   Smoking status: Never   Smokeless tobacco: Never  Vaping Use   Vaping Use: Never used  Substance Use Topics   Alcohol use: Yes    Alcohol/week: 0.0 standard drinks of alcohol    Comment: "not often"   Drug use: No    Family History  Problem Relation Age of Onset   Colon cancer Maternal Grandfather        great   Colon polyps Mother    Heart disease Maternal Grandmother    Irritable bowel syndrome Other     No Known Allergies  Medication list has been reviewed and updated.  Current Outpatient Medications on  File Prior to Visit  Medication Sig Dispense Refill   omeprazole (PRILOSEC) 40 MG capsule TAKE 1 CAPSULE BY MOUTH DAILY BEFORE BREAKFAST 90 capsule 3   No current facility-administered medications on file prior to visit.    Review of Systems:  As per HPI- otherwise negative.   Physical Examination: Vitals:   04/27/22 1319  BP: 120/78  Pulse: 88  Resp: 18  Temp: 97.9 F (36.6 C)  SpO2: 97%   Vitals:   04/27/22 1319  Weight: 213 lb 9.6 oz (96.9 kg)  Height: 6' (1.829 m)   Body mass index is 28.97 kg/m. Ideal Body Weight: Weight in (lb) to have BMI = 25: 183.9  GEN: no acute distress. HEENT: Atraumatic, Normocephalic.  Ears and Nose: No external deformity. CV: RRR, No M/G/R. No JVD. No thrill. No extra heart sounds. PULM: CTA B, no wheezes, crackles, rhonchi. No retractions. No resp. distress. No accessory muscle use. ABD: S, NT, ND. No rebound. No HSM. EXTR: No c/c/e PSYCH: Normally interactive. Conversant.  Cerumen impaction left ear is irrigated and resolved  Both TM then normal on exam   Assessment and Plan: Impacted cerumen of left ear  Gastroesophageal reflux disease, unspecified whether esophagitis present  Cerumen impaction resolved as above  Continue PPI-  GI appt pending soon  Signed Abbe Amsterdam, MD

## 2022-05-06 ENCOUNTER — Encounter: Payer: Self-pay | Admitting: Internal Medicine

## 2022-05-06 ENCOUNTER — Encounter: Payer: Self-pay | Admitting: Physician Assistant

## 2022-05-06 ENCOUNTER — Ambulatory Visit (INDEPENDENT_AMBULATORY_CARE_PROVIDER_SITE_OTHER): Payer: BC Managed Care – PPO | Admitting: Physician Assistant

## 2022-05-06 VITALS — BP 118/78 | HR 85 | Ht 72.0 in | Wt 214.0 lb

## 2022-05-06 DIAGNOSIS — R197 Diarrhea, unspecified: Secondary | ICD-10-CM

## 2022-05-06 DIAGNOSIS — K219 Gastro-esophageal reflux disease without esophagitis: Secondary | ICD-10-CM

## 2022-05-06 DIAGNOSIS — R1013 Epigastric pain: Secondary | ICD-10-CM | POA: Diagnosis not present

## 2022-05-06 DIAGNOSIS — R1110 Vomiting, unspecified: Secondary | ICD-10-CM | POA: Diagnosis not present

## 2022-05-06 MED ORDER — HYOSCYAMINE SULFATE 0.125 MG PO TABS: 0.1250 mg | ORAL_TABLET | ORAL | 6 refills | Status: AC | PRN

## 2022-05-06 NOTE — Progress Notes (Signed)
Subjective:    Patient ID: Alex Marquez, male, 26 y.o.    DOB: 1996-03-06, 26 y.o.   MRN: [id number]  HPI  Alex Marquez is a pleasant 26 year old white male, established with Dr. Carlean Purl.  He was last seen in the office in 2019 and has history of chronic GERD.  He has recently been seen by primary care/Dr. Copeland/Ed Saguier PA-C and was referred back to GI. No prior EGD. He has been on omeprazole 20 mg long-term, he says since around age 46 and in the past that had controlled his reflux symptoms well.  He says reflux was primarily manifested by heartburn and indigestion. He says he has had a difficult year, had been out of work because of back problems, eventually lost his job and then very recently his grandmother passed away.  All of these things have led to an increase in stress and anxiety.  He has had intermittent episodes of epigastric discomfort, followed by diarrhea and sometimes with acute episodes of vomiting.  He feels that these episodes are directly correlated with feeling stressed or anxious.  After he vomits he says usually feels much better and is fine after that. He had gone back to primary care, had omeprazole increased to 40 mg daily about a month ago.  He says he has not been feeling quite distressed over the past couple of weeks and that its been about 3 weeks since he has had an episode of vomiting or diarrhea.  Appetite has been okay, weight has been stable.  He has normal bowel movements in between these episodes. Not currently being bothered by heartburn or indigestion, has no complaints of dysphagia or odynophagia.  No current complaints of epigastric discomfort.  He did have labs done 04/06/2022 with normal CBC, c-Met and lipase.    Review of Systems Pertinent positive and negative review of systems were noted in the above HPI section.  All other review of systems was otherwise negative.   Outpatient Encounter Medications as of 05/06/2022  Medication Sig   hyoscyamine (LEVSIN)  0.125 MG tablet Take 1 tablet (0.125 mg total) by mouth every 4 (four) hours as needed for cramping (diarrhea/nausea/vomiting).   omeprazole (PRILOSEC) 40 MG capsule TAKE 1 CAPSULE BY MOUTH DAILY BEFORE BREAKFAST   No facility-administered encounter medications on file as of 05/06/2022.   No Known Allergies Patient Active Problem List   Diagnosis Date Noted   Bilateral impacted cerumen 03/12/2020   Esophageal reflux 12/22/2014   Social History   Socioeconomic History   Marital status: Single    Spouse name: Not on file   Number of children: 0   Years of education: Not on file   Highest education level: Not on file  Occupational History   Occupation: student   Occupation: body work on cars  Tobacco Use   Smoking status: Never   Smokeless tobacco: Never  Vaping Use   Vaping Use: Never used  Substance and Sexual Activity   Alcohol use: Yes    Alcohol/week: 0.0 standard drinks of alcohol    Comment: "not often"   Drug use: No   Sexual activity: Not on file  Other Topics Concern   Not on file  Social History Narrative   Single, lives with his parents. 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   One to 2 glasses of tea daily as far as caffeine is concerned.   Social Determinants of Health  Financial Resource Strain: Not on file  Food Insecurity: Not on file  Transportation Needs: Not on file  Physical Activity: Not on file  Stress: Not on file  Social Connections: Not on file  Intimate Partner Violence: Not on file    Alex Marquez's family history includes Colon cancer in his maternal grandfather; Colon polyps in his mother; Heart disease in his maternal grandmother; Irritable bowel syndrome in an other family member.      Objective:    Vitals:   05/06/22 0931  BP: 118/78  Pulse: 85    Physical Exam Well-developed well-nourished  young WM  in no acute distress.  Height,  Weight,214 BMI 29.0  HEENT; nontraumatic normocephalic, EOMI, PE R LA, sclera anicteric. Oropharynx;not examined Neck; supple, no JVD Cardiovascular; regular rate and rhythm with S1-S2, no murmur rub or gallop Pulmonary; Clear bilaterally Abdomen; soft, nontender, nondistended, no palpable mass or hepatosplenomegaly, bowel sounds are active Rectal;not done today Skin; benign exam, no jaundice rash or appreciable lesions Extremities; no clubbing cyanosis or edema skin warm and dry Neuro/Psych; alert and oriented x4, grossly nonfocal mood and affect appropriate        Assessment & Plan:   #65 26 year old white male with history of chronic GERD, currently controlled on omeprazole 40 mg p.o. every morning.  Patient has had GERD symptoms over the past 10 years and has been on prescription medication since.  Previously had been on 20 mg of omeprazole now on 40 mg daily over the past month.  #2 recent episodes of epigastric pain, followed by diarrhea and intermittently with vomiting. Per patient history he directly correlates this with occurring during times of stress/anxiety. He feels fine in between these episodes and usually feels fine immediately after vomiting  I suspect these episodes are secondary to IBS/anxiety  Plan; Continue omeprazole 40 mg p.o. every morning Continue antireflux regimen We will give him a trial of Levsin sublingual to take at onset of any acute episode of epigastric discomfort nausea or diarrhea.  Hopefully this will decrease frequency and severity. He feels that he is doing better from an anxiety standpoint now, we did discuss that if he has persistent issues with anxiety, that he should follow-up with primary care who had considered a trial of BuSpar which may be helpful. With longstanding GERD x10 years requiring PPI, appropriate to evaluate at this time with EGD.  Patient will be scheduled for EGD with Dr. Carlean Purl.  Procedure was discussed in detail with the  patient including indications risk and benefits, and he is a agreeable to proceed.  Michelle Vanhise Genia Harold PA-C 05/06/2022   Cc: Mackie Pai, PA-C

## 2022-05-06 NOTE — Patient Instructions (Addendum)
If you are age 26 or younger, your body mass index should be between 19-25. Your Body mass index is 29.02 kg/m. If this is out of the aformentioned range listed, please consider follow up with your Primary Care Provider.  ________________________________________________________  The [facility] providers would like to encourage you to use [facility] to communicate with providers for non-urgent requests or questions.  Due to long hold times on the telephone, sending your provider a message by [facility] may be a faster and more efficient way to get a response.  Please allow 48 business hours for a response.  Please remember that this is for non-urgent requests.  _______________________________________________________  Bonita Quin have been scheduled for an endoscopy. Please follow written instructions given to you at your visit today. If you use inhalers (even only as needed), please bring them with you on the day of your procedure.  Continue Omeprazole 40 mg 1 capsule before breakfast.  START Hyoscyamine dissolve 1 tablet on the tongue every 4 hours as needed for epigastric pain, nausea, vomiting, diarrhea  Continue following an antireflux regimen.  Follow up pending the results of your Endoscopy or as needed.  Thank you for entrusting me with your care and choosing [facility].  Amy Esterwood, PA-C

## 2022-05-11 ENCOUNTER — Encounter: Payer: Self-pay | Admitting: Internal Medicine

## 2022-05-11 ENCOUNTER — Ambulatory Visit (AMBULATORY_SURGERY_CENTER): Payer: BC Managed Care – PPO | Admitting: Internal Medicine

## 2022-05-11 VITALS — BP 116/74 | HR 69 | Temp 98.4°F | Resp 12 | Ht 72.0 in | Wt 214.0 lb

## 2022-05-11 DIAGNOSIS — R1013 Epigastric pain: Secondary | ICD-10-CM

## 2022-05-11 DIAGNOSIS — K317 Polyp of stomach and duodenum: Secondary | ICD-10-CM

## 2022-05-11 DIAGNOSIS — K219 Gastro-esophageal reflux disease without esophagitis: Secondary | ICD-10-CM | POA: Diagnosis not present

## 2022-05-11 MED ORDER — SODIUM CHLORIDE 0.9 % IV SOLN: 500.0000 mL | Freq: Once | INTRAVENOUS | Status: DC

## 2022-05-11 NOTE — Progress Notes (Signed)
History and Physical Interval Note:  05/11/2022 10:14 AM  Alex Marquez  has presented today for endoscopic procedure(s), with the diagnosis of  Encounter Diagnoses  Name Primary?   Chronic GERD Yes   Intermittent epigastric abdominal pain   .  The various methods of evaluation and treatment have been discussed with the patient and/or family. After consideration of risks, benefits and other options for treatment, the patient has consented to  the endoscopic procedure(s).   The patient's history has been reviewed, patient examined, no change in status, stable for endoscopic procedure(s).  I have reviewed the patient's chart and labs.  Questions were answered to the patient's satisfaction.     Iva Boop, MD, [facility]

## 2022-05-11 NOTE — Progress Notes (Signed)
Called to room to assist during endoscopic procedure.  Patient ID and intended procedure confirmed with present staff. Received instructions for my participation in the procedure from the performing physician.  

## 2022-05-11 NOTE — Progress Notes (Signed)
Pt in recovery with monitors in place, VSS. Report given to receiving RN. Bite guard was placed with pt awake to ensure comfort. No dental or soft tissue damage noted. 

## 2022-05-11 NOTE — Patient Instructions (Addendum)
There were some small polyps in the stomach. These are common and not typically an issue. I took some biopsies to be sure.  You might have a hiatal hernia - stomach slips into chest some and can increase reflux. Not a major issue, usually.  Once I get pathology results will contact you.  I appreciate the opportunity to care for you. Iva Boop, MD, FACG  YOU HAD AN ENDOSCOPIC PROCEDURE TODAY AT THE [facility]:   Refer to the procedure report that was given to you for any specific questions about what was found during the examination.  If the procedure report does not answer your questions, please call your gastroenterologist to clarify.  If you requested that your care partner not be given the details of your procedure findings, then the procedure report has been included in a sealed envelope for you to review at your convenience later.  YOU SHOULD EXPECT: Some feelings of bloating in the abdomen. Passage of more gas than usual.  Walking can help get rid of the air that was put into your GI tract during the procedure and reduce the bloating. If you had a lower endoscopy (such as a colonoscopy or flexible sigmoidoscopy) you may notice spotting of blood in your stool or on the toilet paper. If you underwent a bowel prep for your procedure, you may not have a normal bowel movement for a few days.  Please Note:  You might notice some irritation and congestion in your nose or some drainage.  This is from the oxygen used during your procedure.  There is no need for concern and it should clear up in a day or so.  SYMPTOMS TO REPORT IMMEDIATELY:  Following lower endoscopy (colonoscopy or flexible sigmoidoscopy):  Excessive amounts of blood in the stool  Significant tenderness or worsening of abdominal pains  Swelling of the abdomen that is new, acute  Fever of 100F or higher  Following upper endoscopy (EGD)  Vomiting of blood or coffee ground material  New chest pain or pain  under the shoulder blades  Painful or persistently difficult swallowing  New shortness of breath  Fever of 100F or higher  Black, tarry-looking stools  For urgent or emergent issues, a gastroenterologist can be reached at any hour by calling [phone]. Do not use MyChart messaging for urgent concerns.    DIET:  We do recommend a small meal at first, but then you may proceed to your regular diet.  Drink plenty of fluids but you should avoid alcoholic beverages for 24 hours.  ACTIVITY:  You should plan to take it easy for the rest of today and you should NOT DRIVE or use heavy machinery until tomorrow (because of the sedation medicines used during the test).    FOLLOW UP: Our staff will call the number listed on your records the next business day following your procedure.  We will call around 7:15- 8:00 am to check on you and address any questions or concerns that you may have regarding the information given to you following your procedure. If we do not reach you, we will leave a message.  If you develop any symptoms (ie: fever, flu-like symptoms, shortness of breath, cough etc.) before then, please call [phone].  If you test positive for Covid 19 in the 2 weeks post procedure, please call and report this information to [place].    If any biopsies were taken you will be contacted by phone or by letter within the next 1-3  weeks.  Please call us at [phone] if you have not heard about the biopsies in 3 weeks.    SIGNATURES/CONFIDENTIALITY: You and/or your care partner have signed paperwork which will be entered into your electronic medical record.  These signatures attest to the fact that that the information above on your After Visit Summary has been reviewed and is understood.  Full responsibility of the confidentiality of this discharge information lies with you and/or your care-partner.

## 2022-05-11 NOTE — Op Note (Addendum)
[facility] Patient Name: Alex Marquez Procedure Date: 05/11/2022 10:11 AM MRN: [id number] Endoscopist: Iva Boop , MD Age: 26 Referring MD:  Date of Birth: [birth year] Gender: Male Account #: [id number] Procedure:                Upper GI endoscopy Indications:              Epigastric abdominal pain, Esophageal reflux Medicines:                Monitored Anesthesia Care Procedure:                Pre-Anesthesia Assessment:                           - Prior to the procedure, a History and Physical                            was performed, and patient medications and                            allergies were reviewed. The patient's tolerance of                            previous anesthesia was also reviewed. The risks                            and benefits of the procedure and the sedation                            options and risks were discussed with the patient.                            All questions were answered, and informed consent                            was obtained. Prior Anticoagulants: The patient has                            taken no previous anticoagulant or antiplatelet                            agents. ASA Grade Assessment: II - A patient with                            mild systemic disease. After reviewing the risks                            and benefits, the patient was deemed in                            satisfactory condition to undergo the procedure.                           After obtaining informed consent, the endoscope was  passed under direct vision. Throughout the                            procedure, the patient's blood pressure, pulse, and                            oxygen saturations were monitored continuously. The                            Endoscope was introduced through the mouth, and                            advanced to the second part of duodenum. The upper                            GI endoscopy  was accomplished without difficulty.                            The patient tolerated the procedure well. Scope In: Scope Out: Findings:                 The gastroesophageal flap valve was visualized                            endoscopically and classified as Hill Grade II                            (fold present, opens with respiration).                           A few diminutive sessile polyps were found in the                            gastric body. Biopsies were taken with a cold                            forceps for histology. Verification of patient                            identification for the specimen was done. Estimated                            blood loss was minimal.                           The exam was otherwise without abnormality.                           The cardia and gastric fundus were normal on                            retroflexion. Complications:            No immediate complications. Estimated Blood Loss:     Estimated blood loss was minimal. Impression:               -  Gastroesophageal flap valve classified as Hill                            Grade II (fold present, opens with respiration).                           - A few gastric polyps. Biopsied. Look like                            innocent fundic gland polyps.                           - The examination was otherwise normal. Recommendation:           - Patient has a contact number available for                            emergencies. The signs and symptoms of potential                            delayed complications were discussed with the                            patient. Return to normal activities tomorrow.                            Written discharge instructions were provided to the                            patient.                           - Resume previous diet.                           - Continue present medications.                           - Await pathology results. Consider PPI  dose                            reduction back to 20 mg and possible trial off                            medication per discussion in recovery. Iva Boop, MD 05/11/2022 10:44:05 AM This report has been signed electronically.

## 2022-05-12 ENCOUNTER — Telehealth: Payer: Self-pay | Admitting: *Deleted

## 2022-05-12 NOTE — Telephone Encounter (Signed)
  Follow up Call-     05/11/2022    9:29 AM  Call back number  Post procedure Call Back phone  # [phone]  Permission to leave phone message Yes     Patient questions:  Do you have a fever, pain , or abdominal swelling? No. Pain Score  0 *  Have you tolerated food without any problems? Yes.    Have you been able to return to your normal activities? Yes.    Do you have any questions about your discharge instructions: Diet   No. Medications  No. Follow up visit  No.  Do you have questions or concerns about your Care? No.  Actions: * If pain score is 4 or above: No action needed, pain <4.

## 2022-05-17 ENCOUNTER — Other Ambulatory Visit: Payer: Self-pay | Admitting: Internal Medicine

## 2022-05-17 MED ORDER — OMEPRAZOLE 20 MG PO CPDR: 20.0000 mg | DELAYED_RELEASE_CAPSULE | Freq: Every day | ORAL | 0 refills | Status: DC

## 2022-08-15 DIAGNOSIS — M545 Low back pain, unspecified: Secondary | ICD-10-CM | POA: Diagnosis not present

## 2022-08-18 ENCOUNTER — Other Ambulatory Visit: Payer: Self-pay | Admitting: Orthopedic Surgery

## 2022-08-18 DIAGNOSIS — M545 Low back pain, unspecified: Secondary | ICD-10-CM

## 2022-08-23 ENCOUNTER — Ambulatory Visit
Admission: RE | Admit: 2022-08-23 | Discharge: 2022-08-23 | Disposition: A | Discharge status: Home / Self Care | Payer: BC Managed Care – PPO | Source: Ambulatory Visit | Attending: Orthopedic Surgery | Admitting: Orthopedic Surgery

## 2022-08-23 DIAGNOSIS — M545 Low back pain, unspecified: Secondary | ICD-10-CM | POA: Diagnosis not present

## 2022-09-06 DIAGNOSIS — M545 Low back pain, unspecified: Secondary | ICD-10-CM | POA: Diagnosis not present

## 2022-09-27 DIAGNOSIS — R21 Rash and other nonspecific skin eruption: Secondary | ICD-10-CM | POA: Diagnosis not present

## 2022-09-27 DIAGNOSIS — L2089 Other atopic dermatitis: Secondary | ICD-10-CM | POA: Diagnosis not present

## 2022-11-04 ENCOUNTER — Encounter: Payer: Self-pay | Admitting: Family Medicine

## 2023-07-16 ENCOUNTER — Other Ambulatory Visit: Payer: Self-pay | Admitting: Internal Medicine

## 2023-07-18 ENCOUNTER — Encounter: Payer: Self-pay | Admitting: Family Medicine

## 2023-07-18 MED ORDER — OMEPRAZOLE 20 MG PO CPDR: 20.0000 mg | DELAYED_RELEASE_CAPSULE | Freq: Every day | ORAL | 1 refills | Status: AC
